# Patient Record
Sex: Male | Born: 1965 | Race: White | Hispanic: No | Marital: Single | Smoking: Current every day smoker
Health system: Southern US, Community
[De-identification: ages and names within clinical notes are randomized; demographics above are authoritative.]

## PROBLEM LIST (undated history)

## (undated) DIAGNOSIS — I2510 Atherosclerotic heart disease of native coronary artery without angina pectoris: Secondary | ICD-10-CM

## (undated) DIAGNOSIS — K219 Gastro-esophageal reflux disease without esophagitis: Secondary | ICD-10-CM

## (undated) DIAGNOSIS — F419 Anxiety disorder, unspecified: Secondary | ICD-10-CM

## (undated) DIAGNOSIS — I10 Essential (primary) hypertension: Secondary | ICD-10-CM

## (undated) DIAGNOSIS — E785 Hyperlipidemia, unspecified: Secondary | ICD-10-CM

## (undated) DIAGNOSIS — E7800 Pure hypercholesterolemia, unspecified: Secondary | ICD-10-CM

## (undated) DIAGNOSIS — G629 Polyneuropathy, unspecified: Secondary | ICD-10-CM

## (undated) DIAGNOSIS — M1990 Unspecified osteoarthritis, unspecified site: Secondary | ICD-10-CM

## (undated) DIAGNOSIS — J189 Pneumonia, unspecified organism: Secondary | ICD-10-CM

## (undated) DIAGNOSIS — E119 Type 2 diabetes mellitus without complications: Secondary | ICD-10-CM

## (undated) DIAGNOSIS — J449 Chronic obstructive pulmonary disease, unspecified: Secondary | ICD-10-CM

## (undated) DIAGNOSIS — R519 Headache, unspecified: Secondary | ICD-10-CM

## (undated) DIAGNOSIS — I639 Cerebral infarction, unspecified: Secondary | ICD-10-CM

## (undated) HISTORY — DX: Gastro-esophageal reflux disease without esophagitis: K21.9

## (undated) HISTORY — DX: Type 2 diabetes mellitus without complications: E11.9

## (undated) HISTORY — PX: UMBILICAL HERNIA REPAIR: SHX196

## (undated) HISTORY — DX: Hyperlipidemia, unspecified: E78.5

## (undated) HISTORY — DX: Chronic obstructive pulmonary disease, unspecified: J44.9

## (undated) HISTORY — PX: UPPER GASTROINTESTINAL ENDOSCOPY: SHX188

## (undated) HISTORY — PX: APPENDECTOMY: SHX54

## (undated) HISTORY — PX: EXPLORATORY LAPAROTOMY: SUR591

## (undated) HISTORY — DX: Polyneuropathy, unspecified: G62.9

## (undated) HISTORY — DX: Pure hypercholesterolemia, unspecified: E78.00

## (undated) HISTORY — PX: FOOT AMPUTATION: SHX951

---

## 2016-09-23 ENCOUNTER — Encounter (INDEPENDENT_AMBULATORY_CARE_PROVIDER_SITE_OTHER): Payer: Self-pay

## 2016-09-23 ENCOUNTER — Encounter (INDEPENDENT_AMBULATORY_CARE_PROVIDER_SITE_OTHER): Payer: Self-pay | Admitting: Internal Medicine

## 2016-10-06 ENCOUNTER — Ambulatory Visit (INDEPENDENT_AMBULATORY_CARE_PROVIDER_SITE_OTHER): Payer: Self-pay | Admitting: Internal Medicine

## 2016-10-06 ENCOUNTER — Ambulatory Visit (INDEPENDENT_AMBULATORY_CARE_PROVIDER_SITE_OTHER): Payer: PRIVATE HEALTH INSURANCE | Admitting: Internal Medicine

## 2016-10-06 ENCOUNTER — Other Ambulatory Visit (INDEPENDENT_AMBULATORY_CARE_PROVIDER_SITE_OTHER): Payer: Self-pay | Admitting: Internal Medicine

## 2016-10-06 ENCOUNTER — Encounter (INDEPENDENT_AMBULATORY_CARE_PROVIDER_SITE_OTHER): Payer: Self-pay | Admitting: *Deleted

## 2016-10-06 ENCOUNTER — Encounter (INDEPENDENT_AMBULATORY_CARE_PROVIDER_SITE_OTHER): Payer: Self-pay | Admitting: Internal Medicine

## 2016-10-06 DIAGNOSIS — K2100 Gastro-esophageal reflux disease with esophagitis, without bleeding: Secondary | ICD-10-CM | POA: Insufficient documentation

## 2016-10-06 DIAGNOSIS — E119 Type 2 diabetes mellitus without complications: Secondary | ICD-10-CM

## 2016-10-06 DIAGNOSIS — K219 Gastro-esophageal reflux disease without esophagitis: Secondary | ICD-10-CM | POA: Insufficient documentation

## 2016-10-06 DIAGNOSIS — R1313 Dysphagia, pharyngeal phase: Secondary | ICD-10-CM | POA: Insufficient documentation

## 2016-10-06 DIAGNOSIS — E7800 Pure hypercholesterolemia, unspecified: Secondary | ICD-10-CM | POA: Diagnosis not present

## 2016-10-06 HISTORY — DX: Type 2 diabetes mellitus without complications: E11.9

## 2016-10-06 HISTORY — DX: Gastro-esophageal reflux disease without esophagitis: K21.9

## 2016-10-06 HISTORY — DX: Pure hypercholesterolemia, unspecified: E78.00

## 2016-10-06 MED ORDER — PANTOPRAZOLE SODIUM 40 MG PO TBEC: 40.0000 mg | DELAYED_RELEASE_TABLET | Freq: Two times a day (BID) | ORAL | 3 refills | Status: AC

## 2016-10-06 NOTE — Progress Notes (Signed)
Subjective:    Patient ID: Clinton Atkinson, male    DOB: 1965-10-14, 51 y.o.   MRN: [id number]   HPI Referred by Dr. McGee/ [facility] for GERD. He tells me he has been having frequent acid reflux daily. Takes Omeprazole as needed.  Hx of same and underwent an EGD in 2010 by Dr. Allena Katz which revealed erosive antral gastritis. No epigastric pain.  His appetite is okay. Sometimes he skips meals because of the pain . He usually has a BM x 1 a day. No melena or BRRB.  He says he has some dysphagia. He can drink water okay when he has the dysphagia. He has some bloating.  Diabetic  Since 1997   09/10/2009 EGD with Biopsy: Erosive antral gastritis. Esophagitis. Esophageal candidiasis. Small hiatal hernia.  Biopsy: Yeast seen. 11/16/2008 Biopsy: Compatible with Reflux esophagitis., chronic gastropathy (antral).    07/27/2016 Vitamin B 12 723, folate 9.6, H and H 17.8 and 51.1, MCV 98., AST 25, ALP 126, Total bili 0.6, ALT 37 Review of Systems Past Medical History:  Diagnosis Date  . COPD (chronic obstructive pulmonary disease) (HCC)   . Diabetes (HCC)   . GERD (gastroesophageal reflux disease)   . Hyperlipemia   . Neuropathy [facility])     Past Surgical History:  Procedure Laterality Date  . APPENDECTOMY    . EXPLORATORY LAPAROTOMY    . UMBILICAL HERNIA REPAIR    . UPPER GASTROINTESTINAL ENDOSCOPY      Allergies  Allergen Reactions  . Ultram [Tramadol] Itching    No current outpatient prescriptions on file prior to visit.   No current facility-administered medications on file prior to visit.    Current Outpatient Prescriptions  Medication Sig Dispense Refill  . albuterol (VENTOLIN HFA) 108 (90 Base) MCG/ACT inhaler Inhale 2 puffs into the lungs every 6 (six) hours as needed for wheezing or shortness of breath.    . ARIPiprazole (ABILIFY) 15 MG tablet Take 15 mg by mouth daily.    . cetirizine (ZYRTEC) 5 MG tablet Take 5 mg by mouth daily.    . diphenhydrAMINE (BENADRYL) 25 mg  capsule Take 25 mg by mouth every 6 (six) hours as needed.    . fenofibrate (TRICOR) 145 MG tablet Take 145 mg by mouth daily.    Marland Kitchen gabapentin (NEURONTIN) 800 MG tablet Take 800 mg by mouth 3 (three) times daily.    . Insulin Glargine (LANTUS SOLOSTAR) 100 UNIT/ML Solostar Pen Inject 35 Units into the skin 2 (two) times daily.    Marland Kitchen lisinopril (PRINIVIL,ZESTRIL) 2.5 MG tablet Take 2.5 mg by mouth daily.    . meloxicam (MOBIC) 7.5 MG tablet Take 7.5 mg by mouth daily as needed for pain.    . Multiple Vitamins-Minerals (MULTI COMPLETE PO) Take by mouth daily.    Marland Kitchen omeprazole (PRILOSEC) 40 MG capsule Take 40 mg by mouth at bedtime as needed.    . rosuvastatin (CRESTOR) 10 MG tablet Take 10 mg by mouth daily.    . sitaGLIPtin (JANUVIA) 100 MG tablet Take 100 mg by mouth daily.    . insulin lispro (HUMALOG) 100 UNIT/ML injection Inject 16 Units into the skin 3 (three) times daily with meals.    . metFORMIN (GLUCOPHAGE) 500 MG tablet Take 500 mg by mouth daily with breakfast.     No current facility-administered medications for this visit.         Objective:   Physical Exam Blood pressure 130/90, pulse 78, resp. rate 18, height 5\' 9"  (1.753  m), weight 203 lb 6.4 oz (92.3 kg).  Alert and oriented. Skin warm and dry. Oral mucosa is moist.   . Sclera anicteric, conjunctivae is pink. Thyroid not enlarged. No cervical lymphadenopathy. Lungs clear. Heart regular rate and rhythm.  Abdomen is soft. Bowel sounds are positive. No hepatomegaly. No abdominal masses felt. No tenderness.  No edema to lower extremities.          Assessment & Plan:  GERD. Am going to change his Omeprazole to Protonix 40mg  BID. EGD/ED. The risks and benefits such as perforation, bleeding, and infection were reviewed with the patient and is agreeable. Advised to eat 4-6 small meals a day. May need NM Emptying study at a later date.

## 2016-10-06 NOTE — Patient Instructions (Signed)
EGD/ED. The risks and benefits such as perforation, bleeding, and infection were reviewed with the patient and is agreeable. 

## 2016-10-13 ENCOUNTER — Ambulatory Visit (HOSPITAL_COMMUNITY)
Admission: RE | Admit: 2016-10-13 | Payer: PRIVATE HEALTH INSURANCE | Source: Ambulatory Visit | Admitting: Internal Medicine

## 2016-10-13 ENCOUNTER — Encounter (HOSPITAL_COMMUNITY): Admission: RE | Payer: Self-pay | Source: Ambulatory Visit

## 2016-10-13 SURGERY — EGD (ESOPHAGOGASTRODUODENOSCOPY)
Anesthesia: Moderate Sedation

## 2016-11-05 ENCOUNTER — Encounter (INDEPENDENT_AMBULATORY_CARE_PROVIDER_SITE_OTHER): Payer: Self-pay

## 2020-06-27 ENCOUNTER — Encounter (HOSPITAL_COMMUNITY): Payer: Self-pay

## 2020-06-27 ENCOUNTER — Emergency Department (HOSPITAL_COMMUNITY): Payer: Medicaid - Out of State

## 2020-06-27 ENCOUNTER — Other Ambulatory Visit: Payer: Self-pay

## 2020-06-27 ENCOUNTER — Inpatient Hospital Stay (HOSPITAL_COMMUNITY)
Admission: EM | Admit: 2020-06-27 | Discharge: 2020-06-29 | DRG: 902 | Disposition: A | Discharge status: Home / Self Care | Payer: Medicaid - Out of State | Attending: Family Medicine | Admitting: Family Medicine

## 2020-06-27 DIAGNOSIS — M868X7 Other osteomyelitis, ankle and foot: Secondary | ICD-10-CM | POA: Diagnosis present

## 2020-06-27 DIAGNOSIS — Z20822 Contact with and (suspected) exposure to covid-19: Secondary | ICD-10-CM | POA: Diagnosis present

## 2020-06-27 DIAGNOSIS — Z7984 Long term (current) use of oral hypoglycemic drugs: Secondary | ICD-10-CM

## 2020-06-27 DIAGNOSIS — L02416 Cutaneous abscess of left lower limb: Secondary | ICD-10-CM | POA: Diagnosis present

## 2020-06-27 DIAGNOSIS — I2510 Atherosclerotic heart disease of native coronary artery without angina pectoris: Secondary | ICD-10-CM | POA: Diagnosis present

## 2020-06-27 DIAGNOSIS — Z888 Allergy status to other drugs, medicaments and biological substances status: Secondary | ICD-10-CM

## 2020-06-27 DIAGNOSIS — T63301A Toxic effect of unspecified spider venom, accidental (unintentional), initial encounter: Secondary | ICD-10-CM | POA: Diagnosis present

## 2020-06-27 DIAGNOSIS — F17210 Nicotine dependence, cigarettes, uncomplicated: Secondary | ICD-10-CM | POA: Diagnosis present

## 2020-06-27 DIAGNOSIS — Z791 Long term (current) use of non-steroidal anti-inflammatories (NSAID): Secondary | ICD-10-CM

## 2020-06-27 DIAGNOSIS — N400 Enlarged prostate without lower urinary tract symptoms: Secondary | ICD-10-CM | POA: Diagnosis present

## 2020-06-27 DIAGNOSIS — K2100 Gastro-esophageal reflux disease with esophagitis, without bleeding: Secondary | ICD-10-CM | POA: Diagnosis present

## 2020-06-27 DIAGNOSIS — Z794 Long term (current) use of insulin: Secondary | ICD-10-CM | POA: Diagnosis not present

## 2020-06-27 DIAGNOSIS — Z955 Presence of coronary angioplasty implant and graft: Secondary | ICD-10-CM | POA: Diagnosis not present

## 2020-06-27 DIAGNOSIS — E785 Hyperlipidemia, unspecified: Secondary | ICD-10-CM | POA: Diagnosis present

## 2020-06-27 DIAGNOSIS — J449 Chronic obstructive pulmonary disease, unspecified: Secondary | ICD-10-CM | POA: Diagnosis present

## 2020-06-27 DIAGNOSIS — L03116 Cellulitis of left lower limb: Secondary | ICD-10-CM | POA: Diagnosis present

## 2020-06-27 DIAGNOSIS — I10 Essential (primary) hypertension: Secondary | ICD-10-CM | POA: Diagnosis present

## 2020-06-27 DIAGNOSIS — E1165 Type 2 diabetes mellitus with hyperglycemia: Secondary | ICD-10-CM | POA: Diagnosis present

## 2020-06-27 DIAGNOSIS — Z881 Allergy status to other antibiotic agents status: Secondary | ICD-10-CM

## 2020-06-27 DIAGNOSIS — Z79899 Other long term (current) drug therapy: Secondary | ICD-10-CM | POA: Diagnosis not present

## 2020-06-27 DIAGNOSIS — E7800 Pure hypercholesterolemia, unspecified: Secondary | ICD-10-CM | POA: Diagnosis present

## 2020-06-27 DIAGNOSIS — E872 Acidosis, unspecified: Secondary | ICD-10-CM

## 2020-06-27 DIAGNOSIS — T148XXA Other injury of unspecified body region, initial encounter: Secondary | ICD-10-CM

## 2020-06-27 DIAGNOSIS — B351 Tinea unguium: Secondary | ICD-10-CM | POA: Diagnosis present

## 2020-06-27 DIAGNOSIS — E119 Type 2 diabetes mellitus without complications: Secondary | ICD-10-CM

## 2020-06-27 DIAGNOSIS — L089 Local infection of the skin and subcutaneous tissue, unspecified: Secondary | ICD-10-CM | POA: Diagnosis not present

## 2020-06-27 DIAGNOSIS — Z89431 Acquired absence of right foot: Secondary | ICD-10-CM

## 2020-06-27 DIAGNOSIS — E1140 Type 2 diabetes mellitus with diabetic neuropathy, unspecified: Secondary | ICD-10-CM | POA: Diagnosis present

## 2020-06-27 LAB — CBC WITH DIFFERENTIAL/PLATELET
Abs Immature Granulocytes: 0.09 10*3/uL — ABNORMAL HIGH (ref 0.00–0.07)
Basophils Absolute: 0.1 10*3/uL (ref 0.0–0.1)
Basophils Relative: 1 %
Eosinophils Absolute: 0.3 10*3/uL (ref 0.0–0.5)
Eosinophils Relative: 2 %
HCT: 50.1 % (ref 39.0–52.0)
Hemoglobin: 17 g/dL (ref 13.0–17.0)
Immature Granulocytes: 1 %
Lymphocytes Relative: 33 %
Lymphs Abs: 4.1 10*3/uL — ABNORMAL HIGH (ref 0.7–4.0)
MCH: 31.1 pg (ref 26.0–34.0)
MCHC: 33.9 g/dL (ref 30.0–36.0)
MCV: 91.8 fL (ref 80.0–100.0)
Monocytes Absolute: 0.9 10*3/uL (ref 0.1–1.0)
Monocytes Relative: 7 %
Neutro Abs: 7 10*3/uL (ref 1.7–7.7)
Neutrophils Relative %: 56 %
Platelets: 302 10*3/uL (ref 150–400)
RBC: 5.46 MIL/uL (ref 4.22–5.81)
RDW: 12.9 % (ref 11.5–15.5)
WBC: 12.5 10*3/uL — ABNORMAL HIGH (ref 4.0–10.5)
nRBC: 0 % (ref 0.0–0.2)

## 2020-06-27 LAB — COMPREHENSIVE METABOLIC PANEL
ALT: 17 U/L (ref 0–44)
AST: 15 U/L (ref 15–41)
Albumin: 3.6 g/dL (ref 3.5–5.0)
Alkaline Phosphatase: 117 U/L (ref 38–126)
Anion gap: 11 (ref 5–15)
BUN: 18 mg/dL (ref 6–20)
CO2: 23 mmol/L (ref 22–32)
Calcium: 9.2 mg/dL (ref 8.9–10.3)
Chloride: 97 mmol/L — ABNORMAL LOW (ref 98–111)
Creatinine, Ser: 0.83 mg/dL (ref 0.61–1.24)
GFR calc Af Amer: >60 mL/min (ref >60)
GFR calc non Af Amer: >60 mL/min (ref >60)
Glucose, Bld: 584 mg/dL (ref 70–99)
Potassium: 3.8 mmol/L (ref 3.5–5.1)
Sodium: 131 mmol/L — ABNORMAL LOW (ref 135–145)
Total Bilirubin: 0.4 mg/dL (ref 0.3–1.2)
Total Protein: 7.7 g/dL (ref 6.5–8.1)

## 2020-06-27 LAB — CBG MONITORING, ED
Glucose-Capillary: 581 mg/dL (ref 70–99)
Glucose-Capillary: 230 mg/dL — ABNORMAL HIGH (ref 70–99)

## 2020-06-27 LAB — RESPIRATORY PANEL BY RT PCR (FLU A&B, COVID)
Influenza A by PCR: NEGATIVE
Influenza B by PCR: NEGATIVE
SARS Coronavirus 2 by RT PCR: NEGATIVE

## 2020-06-27 LAB — SEDIMENTATION RATE: Sed Rate: 40 mm/hr — ABNORMAL HIGH (ref 0–16)

## 2020-06-27 LAB — LACTIC ACID, PLASMA
Lactic Acid, Venous: 2.7 mmol/L (ref 0.5–1.9)
Lactic Acid, Venous: 2.7 mmol/L (ref 0.5–1.9)

## 2020-06-27 LAB — C-REACTIVE PROTEIN: CRP: 2.6 mg/dL — ABNORMAL HIGH (ref <1.0)

## 2020-06-27 MED ORDER — PRASUGREL HCL 10 MG PO TABS
10.0000 mg | ORAL_TABLET | Freq: Every day | ORAL | Status: DC
  Administered 2020-06-29: 10 mg via ORAL
  Filled 2020-06-27: qty 1

## 2020-06-27 MED ORDER — GADOBUTROL 1 MMOL/ML IV SOLN
7.0000 mL | Freq: Once | INTRAVENOUS | Status: AC | PRN
  Administered 2020-06-27: 7 mL via INTRAVENOUS

## 2020-06-27 MED ORDER — INSULIN ASPART 100 UNIT/ML ~~LOC~~ SOLN
0.0000 [IU] | Freq: Three times a day (TID) | SUBCUTANEOUS | Status: DC
  Administered 2020-06-28: 11 [IU] via SUBCUTANEOUS
  Administered 2020-06-28: 5 [IU] via SUBCUTANEOUS
  Administered 2020-06-28: 11 [IU] via SUBCUTANEOUS
  Administered 2020-06-29 (×2): 8 [IU] via SUBCUTANEOUS

## 2020-06-27 MED ORDER — VANCOMYCIN HCL 1500 MG/300ML IV SOLN
1500.0000 mg | Freq: Once | INTRAVENOUS | Status: AC
  Administered 2020-06-27: 1500 mg via INTRAVENOUS
  Filled 2020-06-27: qty 300

## 2020-06-27 MED ORDER — PIPERACILLIN-TAZOBACTAM 3.375 G IVPB 30 MIN
3.3750 g | Freq: Once | INTRAVENOUS | Status: AC
  Administered 2020-06-27: 3.375 g via INTRAVENOUS
  Filled 2020-06-27: qty 50

## 2020-06-27 MED ORDER — SODIUM CHLORIDE 0.9 % IV SOLN: INTRAVENOUS | Status: DC

## 2020-06-27 MED ORDER — SODIUM CHLORIDE 0.9 % IV BOLUS
1000.0000 mL | Freq: Once | INTRAVENOUS | Status: AC
  Administered 2020-06-27: 1000 mL via INTRAVENOUS

## 2020-06-27 MED ORDER — AMITRIPTYLINE HCL 50 MG PO TABS
50.0000 mg | ORAL_TABLET | Freq: Every day | ORAL | Status: DC
  Administered 2020-06-27 – 2020-06-28 (×2): 50 mg via ORAL
  Filled 2020-06-27: qty 2
  Filled 2020-06-27 (×4): qty 1

## 2020-06-27 MED ORDER — INSULIN ASPART 100 UNIT/ML ~~LOC~~ SOLN
0.0000 [IU] | Freq: Every day | SUBCUTANEOUS | Status: DC
  Administered 2020-06-27: 2 [IU] via SUBCUTANEOUS
  Administered 2020-06-28: 3 [IU] via SUBCUTANEOUS
  Filled 2020-06-27: qty 1

## 2020-06-27 MED ORDER — ROSUVASTATIN CALCIUM 20 MG PO TABS
20.0000 mg | ORAL_TABLET | Freq: Every evening | ORAL | Status: DC
  Administered 2020-06-27 – 2020-06-28 (×2): 20 mg via ORAL
  Filled 2020-06-27 (×5): qty 1

## 2020-06-27 MED ORDER — VANCOMYCIN HCL IN DEXTROSE 1-5 GM/200ML-% IV SOLN: 1000.0000 mg | Freq: Once | INTRAVENOUS | Status: DC

## 2020-06-27 MED ORDER — LISINOPRIL 5 MG PO TABS: 2.5000 mg | ORAL_TABLET | Freq: Every evening | ORAL | Status: DC

## 2020-06-27 MED ORDER — ROPINIROLE HCL 0.5 MG PO TABS
2.0000 mg | ORAL_TABLET | Freq: Every day | ORAL | Status: DC
  Administered 2020-06-27: 2 mg via ORAL
  Filled 2020-06-27 (×5): qty 2
  Filled 2020-06-27: qty 4

## 2020-06-27 MED ORDER — FENOFIBRATE 160 MG PO TABS
160.0000 mg | ORAL_TABLET | Freq: Every day | ORAL | Status: DC
  Filled 2020-06-27 (×3): qty 1

## 2020-06-27 MED ORDER — ACETAMINOPHEN 325 MG PO TABS: 650.0000 mg | ORAL_TABLET | Freq: Four times a day (QID) | ORAL | Status: DC | PRN

## 2020-06-27 MED ORDER — INSULIN ASPART 100 UNIT/ML ~~LOC~~ SOLN
15.0000 [IU] | Freq: Once | SUBCUTANEOUS | Status: AC
  Administered 2020-06-27: 15 [IU] via SUBCUTANEOUS
  Filled 2020-06-27: qty 1

## 2020-06-27 MED ORDER — SODIUM POLYSTYRENE SULFONATE 15 GM/60ML PO SUSP: 15.0000 g | Freq: Once | ORAL | Status: DC

## 2020-06-27 MED ORDER — INSULIN GLARGINE 100 UNIT/ML ~~LOC~~ SOLN
35.0000 [IU] | Freq: Two times a day (BID) | SUBCUTANEOUS | Status: DC
  Administered 2020-06-27: 35 [IU] via SUBCUTANEOUS
  Filled 2020-06-27 (×6): qty 0.35

## 2020-06-27 MED ORDER — FENTANYL CITRATE (PF) 100 MCG/2ML IJ SOLN
50.0000 ug | INTRAMUSCULAR | Status: DC | PRN
  Administered 2020-06-27: 50 ug via INTRAVENOUS
  Filled 2020-06-27: qty 2

## 2020-06-27 MED ORDER — TAMSULOSIN HCL 0.4 MG PO CAPS
0.4000 mg | ORAL_CAPSULE | Freq: Every day | ORAL | Status: DC
  Administered 2020-06-27 – 2020-06-29 (×2): 0.4 mg via ORAL
  Filled 2020-06-27 (×2): qty 1

## 2020-06-27 MED ORDER — SODIUM CHLORIDE 0.9 % IV SOLN
2.0000 g | Freq: Three times a day (TID) | INTRAVENOUS | Status: DC
  Administered 2020-06-28 – 2020-06-29 (×5): 2 g via INTRAVENOUS
  Filled 2020-06-27 (×5): qty 2

## 2020-06-27 MED ORDER — ONDANSETRON 4 MG PO TBDP: 4.0000 mg | ORAL_TABLET | Freq: Two times a day (BID) | ORAL | Status: DC | PRN

## 2020-06-27 MED ORDER — ALBUTEROL SULFATE HFA 108 (90 BASE) MCG/ACT IN AERS
2.0000 | INHALATION_SPRAY | Freq: Four times a day (QID) | RESPIRATORY_TRACT | Status: DC | PRN
  Filled 2020-06-27: qty 6.7

## 2020-06-27 MED ORDER — ACETAMINOPHEN 650 MG RE SUPP: 650.0000 mg | Freq: Four times a day (QID) | RECTAL | Status: DC | PRN

## 2020-06-27 MED ORDER — LOSARTAN POTASSIUM 50 MG PO TABS
25.0000 mg | ORAL_TABLET | Freq: Every day | ORAL | Status: DC
  Administered 2020-06-27 – 2020-06-29 (×2): 25 mg via ORAL
  Filled 2020-06-27 (×2): qty 1

## 2020-06-27 MED ORDER — ENOXAPARIN SODIUM 40 MG/0.4ML ~~LOC~~ SOLN: 40.0000 mg | SUBCUTANEOUS | Status: DC

## 2020-06-27 MED ORDER — PANTOPRAZOLE SODIUM 40 MG PO TBEC
40.0000 mg | DELAYED_RELEASE_TABLET | Freq: Two times a day (BID) | ORAL | Status: DC
  Administered 2020-06-28 – 2020-06-29 (×2): 40 mg via ORAL
  Filled 2020-06-27 (×2): qty 1

## 2020-06-27 MED ORDER — METRONIDAZOLE 500 MG PO TABS
500.0000 mg | ORAL_TABLET | Freq: Three times a day (TID) | ORAL | Status: DC
  Administered 2020-06-27 – 2020-06-29 (×6): 500 mg via ORAL
  Filled 2020-06-27 (×6): qty 1

## 2020-06-27 MED ORDER — GABAPENTIN 400 MG PO CAPS
800.0000 mg | ORAL_CAPSULE | Freq: Three times a day (TID) | ORAL | Status: DC
  Administered 2020-06-27 – 2020-06-29 (×4): 800 mg via ORAL
  Filled 2020-06-27 (×4): qty 2

## 2020-06-27 MED ORDER — ADULT MULTIVITAMIN W/MINERALS CH
1.0000 | ORAL_TABLET | Freq: Every evening | ORAL | Status: DC
  Administered 2020-06-27 – 2020-06-28 (×2): 1 via ORAL
  Filled 2020-06-27 (×2): qty 1

## 2020-06-27 MED ORDER — DULOXETINE HCL 30 MG PO CPEP
30.0000 mg | ORAL_CAPSULE | Freq: Every day | ORAL | Status: DC
  Administered 2020-06-27 – 2020-06-29 (×2): 30 mg via ORAL
  Filled 2020-06-27 (×2): qty 1

## 2020-06-27 MED ORDER — VANCOMYCIN HCL IN DEXTROSE 1-5 GM/200ML-% IV SOLN
1000.0000 mg | Freq: Three times a day (TID) | INTRAVENOUS | Status: DC
  Administered 2020-06-28 – 2020-06-29 (×4): 1000 mg via INTRAVENOUS
  Filled 2020-06-27 (×8): qty 200

## 2020-06-27 MED ORDER — OXYCODONE HCL 5 MG PO TABS: 5.0000 mg | ORAL_TABLET | ORAL | Status: DC | PRN

## 2020-06-27 MED ORDER — ARIPIPRAZOLE 5 MG PO TABS
15.0000 mg | ORAL_TABLET | Freq: Every evening | ORAL | Status: DC
  Administered 2020-06-27 – 2020-06-28 (×2): 15 mg via ORAL
  Filled 2020-06-27: qty 3
  Filled 2020-06-27: qty 1

## 2020-06-27 MED ORDER — LINAGLIPTIN 5 MG PO TABS
5.0000 mg | ORAL_TABLET | Freq: Every day | ORAL | Status: DC
  Administered 2020-06-27 – 2020-06-29 (×2): 5 mg via ORAL
  Filled 2020-06-27 (×6): qty 1

## 2020-06-27 NOTE — ED Triage Notes (Signed)
Pt got bitten by a spider 8 days ago on left ankle. Pt went to [facility] and left. Large wound noted to left ankle.
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
## 2020-06-27 NOTE — Progress Notes (Signed)
Pharmacy Antibiotic Note  Clinton Atkinson is a 54 y.o. male admitted on 06/27/2020 with DFI.  Pharmacy has been consulted for vancomycin and cefepime dosing.  Given zosyn + vanc 1500 mg x 1 in ED.  Flagyl per MD.  Plan: Vancomycin 1000 mg IV every 8 hours (target vancomycin trough 15-20) Cefepime 2g IV every 8 hours Monitor renal function, Cx and clinical progression to narrow Vancomycin trough at steady state  Height: 5\' 9"  (175.3 cm) Weight: 79.4 kg (175 lb) IBW/kg (Calculated) : 70.7  Temp (24hrs), Avg:97.6 F (36.4 C), Min:97.6 F (36.4 C), Max:97.6 F (36.4 C)  Recent Labs  Lab 06/27/20 1543 06/27/20 1735  WBC 12.5*  --   CREATININE 0.83  --   LATICACIDVEN 2.7* 2.7*    Estimated Creatinine Clearance: 102.9 mL/min (by C-G formula based on SCr of 0.83 mg/dL).    Allergies  Allergen Reactions  . Metformin Diarrhea    Stomach cramp/inability to eat  . Ultram [Tramadol] Itching    08/27/20, PharmD Clinical Pharmacist ED Pharmacist Phone # [phone] 06/27/2020 6:53 PM
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
## 2020-06-27 NOTE — ED Provider Notes (Signed)
ATTENDING SUPERVISORY NOTE I have personally viewed the imaging studies performed. I have personally seen and examined the patient, and discussed the plan of care with the resident.  I have reviewed the documentation of the resident and agree.  No diagnosis found.  .Critical Care Performed by: Blane Ohara, MD Authorized by: Blane Ohara, MD   Critical care provider statement:    Critical care time (minutes):  35   Critical care start time:  06/27/2020 4:40 PM   Critical care was time spent personally by me on the following activities:  Evaluation of patient's response to treatment, examination of patient, ordering and performing treatments and interventions, ordering and review of laboratory studies, ordering and review of radiographic studies, pulse oximetry, re-evaluation of patient's condition and review of old charts Comments:     IV abx, worsening skin infection in DM      Blane Ohara, MD 06/28/20 0041

## 2020-06-28 ENCOUNTER — Inpatient Hospital Stay (HOSPITAL_COMMUNITY): Payer: Medicaid - Out of State | Admitting: Certified Registered Nurse Anesthetist

## 2020-06-28 ENCOUNTER — Encounter (HOSPITAL_COMMUNITY): Payer: Self-pay | Admitting: Internal Medicine

## 2020-06-28 ENCOUNTER — Encounter (HOSPITAL_COMMUNITY)
Admission: EM | Disposition: A | Discharge status: Home / Self Care | Payer: Self-pay | Source: Home / Self Care | Attending: Family Medicine

## 2020-06-28 ENCOUNTER — Other Ambulatory Visit: Payer: Self-pay

## 2020-06-28 DIAGNOSIS — T148XXA Other injury of unspecified body region, initial encounter: Secondary | ICD-10-CM | POA: Diagnosis not present

## 2020-06-28 DIAGNOSIS — L089 Local infection of the skin and subcutaneous tissue, unspecified: Secondary | ICD-10-CM | POA: Diagnosis not present

## 2020-06-28 HISTORY — PX: APPLICATION OF WOUND VAC: SHX5189

## 2020-06-28 HISTORY — PX: I & D EXTREMITY: SHX5045

## 2020-06-28 LAB — HEMOGLOBIN A1C
Hgb A1c MFr Bld: 12.8 % — ABNORMAL HIGH (ref 4.8–5.6)
Mean Plasma Glucose: 320.66 mg/dL

## 2020-06-28 LAB — POCT I-STAT, CHEM 8
BUN: 15 mg/dL (ref 6–20)
Calcium, Ion: 1.14 mmol/L — ABNORMAL LOW (ref 1.15–1.40)
Chloride: 103 mmol/L (ref 98–111)
Creatinine, Ser: 0.5 mg/dL — ABNORMAL LOW (ref 0.61–1.24)
Glucose, Bld: 313 mg/dL — ABNORMAL HIGH (ref 70–99)
HCT: 41 % (ref 39.0–52.0)
Hemoglobin: 13.9 g/dL (ref 13.0–17.0)
Potassium: 4 mmol/L (ref 3.5–5.1)
Sodium: 136 mmol/L (ref 135–145)
TCO2: 23 mmol/L (ref 22–32)

## 2020-06-28 LAB — CBC
HCT: 40.6 % (ref 39.0–52.0)
Hemoglobin: 13.4 g/dL (ref 13.0–17.0)
MCH: 30.5 pg (ref 26.0–34.0)
MCHC: 33 g/dL (ref 30.0–36.0)
MCV: 92.5 fL (ref 80.0–100.0)
Platelets: 222 10*3/uL (ref 150–400)
RBC: 4.39 MIL/uL (ref 4.22–5.81)
RDW: 12.7 % (ref 11.5–15.5)
WBC: 10.8 10*3/uL — ABNORMAL HIGH (ref 4.0–10.5)
nRBC: 0 % (ref 0.0–0.2)

## 2020-06-28 LAB — SURGICAL PCR SCREEN
MRSA, PCR: NEGATIVE
Staphylococcus aureus: NEGATIVE

## 2020-06-28 LAB — GLUCOSE, CAPILLARY
Glucose-Capillary: 301 mg/dL — ABNORMAL HIGH (ref 70–99)
Glucose-Capillary: 312 mg/dL — ABNORMAL HIGH (ref 70–99)
Glucose-Capillary: 202 mg/dL — ABNORMAL HIGH (ref 70–99)
Glucose-Capillary: 236 mg/dL — ABNORMAL HIGH (ref 70–99)
Glucose-Capillary: 341 mg/dL — ABNORMAL HIGH (ref 70–99)

## 2020-06-28 LAB — CREATININE, SERUM
Creatinine, Ser: 0.75 mg/dL (ref 0.61–1.24)
GFR calc Af Amer: >60 mL/min (ref >60)
GFR calc non Af Amer: >60 mL/min (ref >60)

## 2020-06-28 LAB — PREALBUMIN: Prealbumin: 18.7 mg/dL (ref 18–38)

## 2020-06-28 LAB — HIV ANTIBODY (ROUTINE TESTING W REFLEX): HIV Screen 4th Generation wRfx: NONREACTIVE

## 2020-06-28 SURGERY — IRRIGATION AND DEBRIDEMENT EXTREMITY
Anesthesia: General | Site: Ankle | Laterality: Left

## 2020-06-28 MED ORDER — ALBUMIN HUMAN 5 % IV SOLN
INTRAVENOUS | Status: AC
  Filled 2020-06-28: qty 250

## 2020-06-28 MED ORDER — ONDANSETRON HCL 4 MG PO TABS: 4.0000 mg | ORAL_TABLET | Freq: Four times a day (QID) | ORAL | Status: DC | PRN

## 2020-06-28 MED ORDER — METHOCARBAMOL 500 MG PO TABS: 500.0000 mg | ORAL_TABLET | Freq: Four times a day (QID) | ORAL | Status: DC | PRN

## 2020-06-28 MED ORDER — ONDANSETRON HCL 4 MG/2ML IJ SOLN: 4.0000 mg | Freq: Four times a day (QID) | INTRAMUSCULAR | Status: DC | PRN

## 2020-06-28 MED ORDER — OXYCODONE HCL 5 MG PO TABS: 5.0000 mg | ORAL_TABLET | Freq: Once | ORAL | Status: DC | PRN

## 2020-06-28 MED ORDER — LACTATED RINGERS IV SOLN: INTRAVENOUS | Status: DC

## 2020-06-28 MED ORDER — ACETAMINOPHEN 325 MG PO TABS: 325.0000 mg | ORAL_TABLET | Freq: Once | ORAL | Status: DC | PRN

## 2020-06-28 MED ORDER — CHLORHEXIDINE GLUCONATE 0.12 % MT SOLN
OROMUCOSAL | Status: AC
  Administered 2020-06-28: 15 mL via OROMUCOSAL
  Filled 2020-06-28: qty 15

## 2020-06-28 MED ORDER — EPHEDRINE SULFATE-NACL 50-0.9 MG/10ML-% IV SOSY
PREFILLED_SYRINGE | INTRAVENOUS | Status: DC | PRN
  Administered 2020-06-28: 15 mg via INTRAVENOUS

## 2020-06-28 MED ORDER — ACETAMINOPHEN 160 MG/5ML PO SOLN: 325.0000 mg | Freq: Once | ORAL | Status: DC | PRN

## 2020-06-28 MED ORDER — OXYCODONE HCL 5 MG/5ML PO SOLN: 5.0000 mg | Freq: Once | ORAL | Status: DC | PRN

## 2020-06-28 MED ORDER — INSULIN GLARGINE 100 UNIT/ML ~~LOC~~ SOLN
45.0000 [IU] | Freq: Two times a day (BID) | SUBCUTANEOUS | Status: DC
  Administered 2020-06-28 – 2020-06-29 (×2): 45 [IU] via SUBCUTANEOUS
  Filled 2020-06-28 (×3): qty 0.45

## 2020-06-28 MED ORDER — INSULIN ASPART 100 UNIT/ML ~~LOC~~ SOLN
SUBCUTANEOUS | Status: AC
  Filled 2020-06-28: qty 1

## 2020-06-28 MED ORDER — PHENYLEPHRINE 40 MCG/ML (10ML) SYRINGE FOR IV PUSH (FOR BLOOD PRESSURE SUPPORT)
PREFILLED_SYRINGE | INTRAVENOUS | Status: DC | PRN
  Administered 2020-06-28: 120 ug via INTRAVENOUS
  Administered 2020-06-28: 80 ug via INTRAVENOUS

## 2020-06-28 MED ORDER — MAGNESIUM CITRATE PO SOLN: 1.0000 | Freq: Once | ORAL | Status: DC | PRN

## 2020-06-28 MED ORDER — CEFAZOLIN SODIUM-DEXTROSE 1-4 GM/50ML-% IV SOLN: 1.0000 g | Freq: Four times a day (QID) | INTRAVENOUS | Status: DC

## 2020-06-28 MED ORDER — PHENYLEPHRINE 40 MCG/ML (10ML) SYRINGE FOR IV PUSH (FOR BLOOD PRESSURE SUPPORT)
PREFILLED_SYRINGE | INTRAVENOUS | Status: AC
  Filled 2020-06-28: qty 10

## 2020-06-28 MED ORDER — CELECOXIB 200 MG PO CAPS
200.0000 mg | ORAL_CAPSULE | Freq: Two times a day (BID) | ORAL | Status: DC
  Administered 2020-06-28 – 2020-06-29 (×2): 200 mg via ORAL
  Filled 2020-06-28 (×2): qty 1

## 2020-06-28 MED ORDER — ACETAMINOPHEN 10 MG/ML IV SOLN: 1000.0000 mg | Freq: Once | INTRAVENOUS | Status: DC | PRN

## 2020-06-28 MED ORDER — ALBUMIN HUMAN 5 % IV SOLN: 12.5000 g | Freq: Once | INTRAVENOUS | Status: DC

## 2020-06-28 MED ORDER — AMISULPRIDE (ANTIEMETIC) 5 MG/2ML IV SOLN: 10.0000 mg | Freq: Once | INTRAVENOUS | Status: DC | PRN

## 2020-06-28 MED ORDER — HYDROMORPHONE HCL 1 MG/ML IJ SOLN
INTRAMUSCULAR | Status: AC
Start: 2020-06-28 — End: 2020-06-28
  Filled 2020-06-28: qty 1

## 2020-06-28 MED ORDER — DIPHENHYDRAMINE HCL 12.5 MG/5ML PO ELIX: 12.5000 mg | ORAL_SOLUTION | ORAL | Status: DC | PRN

## 2020-06-28 MED ORDER — EPHEDRINE 5 MG/ML INJ
INTRAVENOUS | Status: AC
  Filled 2020-06-28: qty 10

## 2020-06-28 MED ORDER — FENTANYL CITRATE (PF) 100 MCG/2ML IJ SOLN
INTRAMUSCULAR | Status: DC | PRN
Start: 2020-06-28 — End: 2020-06-28
  Administered 2020-06-28: 50 ug via INTRAVENOUS

## 2020-06-28 MED ORDER — METOCLOPRAMIDE HCL 5 MG/ML IJ SOLN: 5.0000 mg | Freq: Three times a day (TID) | INTRAMUSCULAR | Status: DC | PRN

## 2020-06-28 MED ORDER — CHLORHEXIDINE GLUCONATE 0.12 % MT SOLN: 15.0000 mL | Freq: Once | OROMUCOSAL | Status: AC

## 2020-06-28 MED ORDER — DOCUSATE SODIUM 100 MG PO CAPS
100.0000 mg | ORAL_CAPSULE | Freq: Two times a day (BID) | ORAL | Status: DC
  Administered 2020-06-28 – 2020-06-29 (×2): 100 mg via ORAL
  Filled 2020-06-28 (×2): qty 1

## 2020-06-28 MED ORDER — MEPERIDINE HCL 25 MG/ML IJ SOLN: 6.2500 mg | INTRAMUSCULAR | Status: DC | PRN

## 2020-06-28 MED ORDER — OXYCODONE HCL 5 MG PO TABS: 5.0000 mg | ORAL_TABLET | ORAL | Status: DC | PRN

## 2020-06-28 MED ORDER — HYDROMORPHONE HCL 1 MG/ML IJ SOLN: 0.5000 mg | INTRAMUSCULAR | Status: DC | PRN

## 2020-06-28 MED ORDER — ACETAMINOPHEN 500 MG PO TABS
1000.0000 mg | ORAL_TABLET | Freq: Three times a day (TID) | ORAL | Status: AC
  Administered 2020-06-28 – 2020-06-29 (×4): 1000 mg via ORAL
  Filled 2020-06-28 (×4): qty 2

## 2020-06-28 MED ORDER — METOCLOPRAMIDE HCL 5 MG PO TABS: 5.0000 mg | ORAL_TABLET | Freq: Three times a day (TID) | ORAL | Status: DC | PRN

## 2020-06-28 MED ORDER — PROPOFOL 10 MG/ML IV BOLUS
INTRAVENOUS | Status: AC
  Filled 2020-06-28: qty 20

## 2020-06-28 MED ORDER — LIDOCAINE 2% (20 MG/ML) 5 ML SYRINGE
INTRAMUSCULAR | Status: AC
  Filled 2020-06-28: qty 5

## 2020-06-28 MED ORDER — ALBUMIN HUMAN 5 % IV SOLN
12.5000 g | Freq: Once | INTRAVENOUS | Status: AC
  Administered 2020-06-28: 12.5 g via INTRAVENOUS

## 2020-06-28 MED ORDER — ENOXAPARIN SODIUM 40 MG/0.4ML ~~LOC~~ SOLN
40.0000 mg | SUBCUTANEOUS | Status: DC
  Administered 2020-06-29: 40 mg via SUBCUTANEOUS
  Filled 2020-06-28: qty 0.4

## 2020-06-28 MED ORDER — FENTANYL CITRATE (PF) 250 MCG/5ML IJ SOLN
INTRAMUSCULAR | Status: AC
  Filled 2020-06-28: qty 5

## 2020-06-28 MED ORDER — SODIUM CHLORIDE 0.9 % IR SOLN
Status: DC | PRN
  Administered 2020-06-28: 3000 mL

## 2020-06-28 MED ORDER — POLYETHYLENE GLYCOL 3350 17 G PO PACK: 17.0000 g | PACK | Freq: Every day | ORAL | Status: DC | PRN

## 2020-06-28 MED ORDER — ONDANSETRON HCL 4 MG/2ML IJ SOLN
INTRAMUSCULAR | Status: AC
  Filled 2020-06-28: qty 2

## 2020-06-28 MED ORDER — MIDAZOLAM HCL 5 MG/5ML IJ SOLN
INTRAMUSCULAR | Status: DC | PRN
  Administered 2020-06-28: 2 mg via INTRAVENOUS

## 2020-06-28 MED ORDER — PROPOFOL 10 MG/ML IV BOLUS
INTRAVENOUS | Status: DC | PRN
  Administered 2020-06-28: 150 mg via INTRAVENOUS

## 2020-06-28 MED ORDER — BISACODYL 5 MG PO TBEC: 5.0000 mg | DELAYED_RELEASE_TABLET | Freq: Every day | ORAL | Status: DC | PRN

## 2020-06-28 MED ORDER — METHOCARBAMOL 1000 MG/10ML IJ SOLN
500.0000 mg | Freq: Four times a day (QID) | INTRAVENOUS | Status: DC | PRN
  Filled 2020-06-28: qty 5

## 2020-06-28 MED ORDER — ONDANSETRON HCL 4 MG/2ML IJ SOLN
INTRAMUSCULAR | Status: DC | PRN
  Administered 2020-06-28: 4 mg via INTRAVENOUS

## 2020-06-28 MED ORDER — MIDAZOLAM HCL 2 MG/2ML IJ SOLN
INTRAMUSCULAR | Status: AC
  Filled 2020-06-28: qty 2

## 2020-06-28 MED ORDER — OXYCODONE HCL 5 MG PO TABS
10.0000 mg | ORAL_TABLET | ORAL | Status: DC | PRN
  Administered 2020-06-28 – 2020-06-29 (×2): 10 mg via ORAL
  Filled 2020-06-28 (×2): qty 2

## 2020-06-28 MED ORDER — LIDOCAINE 2% (20 MG/ML) 5 ML SYRINGE
INTRAMUSCULAR | Status: DC | PRN
  Administered 2020-06-28: 40 mg via INTRAVENOUS

## 2020-06-28 MED ORDER — HYDROMORPHONE HCL 1 MG/ML IJ SOLN
0.2500 mg | INTRAMUSCULAR | Status: DC | PRN
  Administered 2020-06-28: 0.5 mg via INTRAVENOUS

## 2020-06-28 MED ORDER — INSULIN ASPART 100 UNIT/ML ~~LOC~~ SOLN
10.0000 [IU] | Freq: Once | SUBCUTANEOUS | Status: AC
  Administered 2020-06-28: 10 [IU] via INTRAVENOUS

## 2020-06-28 SURGICAL SUPPLY — 56 items
BLADE SURG 10 STRL SS (BLADE) ×2 IMPLANT
BNDG COHESIVE 4X5 TAN STRL (GAUZE/BANDAGES/DRESSINGS) ×2 IMPLANT
BNDG ELASTIC 4X5.8 VLCR STR LF (GAUZE/BANDAGES/DRESSINGS) ×2 IMPLANT
BNDG ELASTIC 6X5.8 VLCR STR LF (GAUZE/BANDAGES/DRESSINGS) ×2 IMPLANT
BNDG GAUZE ELAST 4 BULKY (GAUZE/BANDAGES/DRESSINGS) ×2 IMPLANT
CNTNR URN SCR LID CUP LEK RST (MISCELLANEOUS) IMPLANT
CONT SPEC 4OZ STRL OR WHT (MISCELLANEOUS)
COVER SURGICAL LIGHT HANDLE (MISCELLANEOUS) ×2 IMPLANT
CUFF TOURN SGL QUICK 34 (TOURNIQUET CUFF)
CUFF TRNQT CYL 34X4.125X (TOURNIQUET CUFF) IMPLANT
DRAPE DERMATAC (DRAPES) ×1 IMPLANT
DRAPE U-SHAPE 47X51 STRL (DRAPES) ×1 IMPLANT
DRESSING VERAFLO CLEANSE CC (GAUZE/BANDAGES/DRESSINGS) IMPLANT
DRSG PAD ABDOMINAL 8X10 ST (GAUZE/BANDAGES/DRESSINGS) ×2 IMPLANT
DRSG VERAFLO CLEANSE CC (GAUZE/BANDAGES/DRESSINGS) ×2
DURAPREP 26ML APPLICATOR (WOUND CARE) ×2 IMPLANT
ELECT REM PT RETURN 9FT ADLT (ELECTROSURGICAL)
ELECTRODE REM PT RTRN 9FT ADLT (ELECTROSURGICAL) IMPLANT
EVACUATOR 1/8 PVC DRAIN (DRAIN) IMPLANT
FACESHIELD WRAPAROUND (MASK) ×2 IMPLANT
FACESHIELD WRAPAROUND OR TEAM (MASK) ×1 IMPLANT
GAUZE SPONGE 4X4 12PLY STRL (GAUZE/BANDAGES/DRESSINGS) ×2 IMPLANT
GLOVE BIO SURGEON STRL SZ7.5 (GLOVE) ×2 IMPLANT
GLOVE BIOGEL PI IND STRL 7.5 (GLOVE) ×1 IMPLANT
GLOVE BIOGEL PI IND STRL 8 (GLOVE) ×1 IMPLANT
GLOVE BIOGEL PI INDICATOR 7.5 (GLOVE) ×1
GLOVE BIOGEL PI INDICATOR 8 (GLOVE) ×1
GLOVE SURG SYN 7.5  E (GLOVE) ×1
GLOVE SURG SYN 7.5 E (GLOVE) ×1 IMPLANT
GLOVE SURG SYN 7.5 PF PI (GLOVE) ×1 IMPLANT
GOWN STRL REUS W/ TWL LRG LVL3 (GOWN DISPOSABLE) ×2 IMPLANT
GOWN STRL REUS W/ TWL XL LVL3 (GOWN DISPOSABLE) ×2 IMPLANT
GOWN STRL REUS W/TWL LRG LVL3 (GOWN DISPOSABLE) ×2
GOWN STRL REUS W/TWL XL LVL3 (GOWN DISPOSABLE) ×2
HANDPIECE INTERPULSE COAX TIP (DISPOSABLE)
KIT BASIN OR (CUSTOM PROCEDURE TRAY) ×2 IMPLANT
KIT TURNOVER KIT B (KITS) ×2 IMPLANT
MANIFOLD NEPTUNE II (INSTRUMENTS) ×2 IMPLANT
NDL HYPO 25GX1X1/2 BEV (NEEDLE) IMPLANT
NEEDLE HYPO 25GX1X1/2 BEV (NEEDLE) IMPLANT
NS IRRIG 1000ML POUR BTL (IV SOLUTION) ×2 IMPLANT
PACK ORTHO EXTREMITY (CUSTOM PROCEDURE TRAY) ×2 IMPLANT
PAD ARMBOARD 7.5X6 YLW CONV (MISCELLANEOUS) ×4 IMPLANT
PAD NEG PRESSURE SENSATRAC (MISCELLANEOUS) ×1 IMPLANT
SET CYSTO W/LG BORE CLAMP LF (SET/KITS/TRAYS/PACK) IMPLANT
SET HNDPC FAN SPRY TIP SCT (DISPOSABLE) IMPLANT
SPONGE LAP 18X18 RF (DISPOSABLE) IMPLANT
STOCKINETTE IMPERVIOUS 9X36 MD (GAUZE/BANDAGES/DRESSINGS) ×2 IMPLANT
SUT ETHILON 3 0 PS 1 (SUTURE) IMPLANT
SUT PDS AB 2-0 CT1 27 (SUTURE) IMPLANT
SYR CONTROL 10ML LL (SYRINGE) IMPLANT
TOWEL GREEN STERILE (TOWEL DISPOSABLE) ×2 IMPLANT
TOWEL GREEN STERILE FF (TOWEL DISPOSABLE) ×2 IMPLANT
TUBE CONNECTING 12X1/4 (SUCTIONS) ×2 IMPLANT
UNDERPAD 30X36 HEAVY ABSORB (UNDERPADS AND DIAPERS) ×2 IMPLANT
YANKAUER SUCT BULB TIP NO VENT (SUCTIONS) ×2 IMPLANT

## 2020-06-28 NOTE — Progress Notes (Signed)
Inpatient Diabetes Program Recommendations  AACE/ADA: New Consensus Statement on Inpatient Glycemic Control   Target Ranges:  Prepandial:   less than 140 mg/dL      Peak postprandial:   less than 180 mg/dL (1-2 hours)      Critically ill patients:  140 - 180 mg/dL   Results for Clinton Atkinson, Clinton Atkinson (MRN [id number]) as of 06/28/2020 14:09  Ref. Range 06/28/2020 07:04 06/28/2020 09:39 06/28/2020 11:36 06/28/2020 11:51  Glucose-Capillary Latest Ref Range: 70 - 99 mg/dL 176 (H)  Novolog 11 units 312 (H)  Novolog 10 uints 202 (H) 236 (H)  Novolog 5 units  Results for Clinton Atkinson, Clinton Atkinson (MRN [id number]) as of 06/28/2020 14:09  Ref. Range 06/27/2020 17:28 06/27/2020 21:24  Glucose-Capillary Latest Ref Range: 70 - 99 mg/dL 269 (HH)  Novolog 15 units 230 (H)  Novolog 2 units  Lantus 35 units   Review of Glycemic Control  Diabetes history: DM2 Outpatient Diabetes medications: Lantus 35 units BID, Januvia 100 mg daily Current orders for Inpatient glycemic control: Lantus 35 units BID, Novolog 0-15 units TID with meals, Novolog 0-5 units QHS, Tradjenta 5 mg daily  Inpatient Diabetes Program Recommendations:    Insulin: Noted 10 am dose of Lantus was not given this morning as patient was in procedure.  NOTE: Noted consult for Diabetes Coordinator. Diabetes Coordinator is not on campus over the weekend but available by pager from 8am to 5pm for questions or concerns. Chart reviewed. Noted office visit note by Dr. Shirleen Schirmer (with [facility]) on 04/03/20 which indicates that patient was prescribed Humalog 20 units before each meal, Lantus 50 units at bedtime, and Januvia 100 mg daily for outpatient DM medications. Tried to call patient's room phone several times to confirm outpatient DM medications but phone busy. Will continue to follow along while inpatient.  Thanks, Orlando Penner, RN, MSN, CDE Diabetes Coordinator Inpatient Diabetes Program [phone] (Team Pager from 8am to 5pm)

## 2020-06-28 NOTE — ED Notes (Signed)
Handoff report finally given to [employer].
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
## 2020-06-28 NOTE — ED Notes (Signed)
RN made multiple attempts to call report to receiving RN at [facility] and placed on hold or transferred multiple times without success.

## 2020-06-28 NOTE — Consult Note (Signed)
   ORTHOPAEDIC CONSULTATION  REQUESTING PHYSICIAN: Pahwani, Ravi, MD  Chief Complaint: left ankle wound   HPI: Clinton [facility] is a 54 y.o. male with medical history of type 2 diabetes mellitus, insulin-dependent, CAD, hypertension, hyperlipidemia. He came to [facility] due to a non-healing wound on his left ankle. He believes he was bit by a spider over a week ago. His PCP treated him with oral antibiotics which did not help. The black eschar caused him to come to the ED. Orthopedics was consulted for evaluation.  Upon examination, patient sleep in hospital bed. No acute distress. Saying he has pain in left ankle and left foot. He has diabetic neuropathy with uncontrolled diabetes. Glucose levels were 584 in the ED. He denies any pain besides left lower extremity. Previous transmetatarsal amputation of right foot by Dr. Loti (sp?) In [place]. He said it started off as a wound on the bottom of his foot. He believes he was treated wrong for this. He thought about suing. He complains about the doctors in [place] except for Dr. Loti (sp?). He says he walks with a cane occasionally but not all the time. He is already asking to go home.  Past Medical History:  Diagnosis Date  . COPD (chronic obstructive pulmonary disease) (HCC)   . Diabetes (HCC)   . Diabetes (HCC) 10/06/2016  . GERD (gastroesophageal reflux disease)   . GERD (gastroesophageal reflux disease) 10/06/2016  . High cholesterol 10/06/2016  . Hyperlipemia   . Neuropathy    Past Surgical History:  Procedure Laterality Date  . APPENDECTOMY    . EXPLORATORY LAPAROTOMY    . FOOT AMPUTATION    . UMBILICAL HERNIA REPAIR    . UPPER GASTROINTESTINAL ENDOSCOPY     Social History   Socioeconomic History  . Marital status: Single    Spouse name: Not on file  . Number of children: Not on file  . Years of education: Not on file  . Highest education level: Not on file  Occupational History  . Not on file   Tobacco Use  . Smoking status: Current Every Day Smoker    Packs/day: 1.00    Types: Cigarettes  . Smokeless tobacco: Never Used  Substance and Sexual Activity  . Alcohol use: Yes    Comment: vodka  . Drug use: No  . Sexual activity: Not on file  Other Topics Concern  . Not on file  Social History Narrative  . Not on file   Social Determinants of Health   Financial Resource Strain:   . Difficulty of Paying Living Expenses: Not on file  Food Insecurity:   . Worried About Running Out of Food in the Last Year: Not on file  . Ran Out of Food in the Last Year: Not on file  Transportation Needs:   . Lack of Transportation (Medical): Not on file  . Lack of Transportation (Non-Medical): Not on file  Physical Activity:   . Days of Exercise per Week: Not on file  . Minutes of Exercise per Session: Not on file  Stress:   . Feeling of Stress : Not on file  Social Connections:   . Frequency of Communication with Friends and Family: Not on file  . Frequency of Social Gatherings with Friends and Family: Not on file  . Attends Religious Services: Not on file  . Active Member of Clubs or Organizations: Not on file  . Attends Club or Organization Meetings: Not on file  . Marital Status: Not   on file   No family history on file. Allergies  Allergen Reactions  . Doxycycline Hyclate Diarrhea  . Metformin Diarrhea    Stomach cramp/inability to eat  . Ultram [Tramadol] Itching   Prior to Admission medications   Medication Sig Start Date End Date Taking? Authorizing Provider  albuterol (VENTOLIN HFA) 108 (90 Base) MCG/ACT inhaler Inhale 2 puffs into the lungs every 6 (six) hours as needed for wheezing or shortness of breath.   Yes [provider]  amitriptyline (ELAVIL) 25 MG tablet Take 50 mg by mouth at bedtime. 06/13/20  Yes [provider]  cephALEXin (KEFLEX) 500 MG capsule Take 500 mg by mouth 4 (four) times daily. 06/19/20  Yes [provider]  cetirizine  (ZYRTEC) 10 MG tablet Take 10 mg by mouth every evening.   Yes [provider]  DULoxetine (CYMBALTA) 60 MG capsule Take 30 mg by mouth daily.   Yes [provider]  gabapentin (NEURONTIN) 800 MG tablet Take 800 mg by mouth 3 (three) times daily.   Yes [provider]  ibuprofen (ADVIL) 800 MG tablet Take 800 mg by mouth 3 (three) times daily.   Yes [provider]  Insulin Glargine (LANTUS SOLOSTAR) 100 UNIT/ML Solostar Pen Inject 35 Units into the skin 2 (two) times daily.   Yes [provider]  ketorolac (TORADOL) 10 MG tablet Take 10 mg by mouth 4 (four) times daily as needed. 06/21/20  Yes [provider]  lisinopril (PRINIVIL,ZESTRIL) 2.5 MG tablet Take 2.5 mg by mouth every evening.    Yes [provider]  losartan (COZAAR) 25 MG tablet Take 25 mg by mouth daily. 06/24/20  Yes [provider]  Multiple Vitamin (MULTIVITAMIN WITH MINERALS) TABS tablet Take 1 tablet by mouth every evening.   Yes [provider]  nitroGLYCERIN (NITROSTAT) 0.4 MG SL tablet Place 0.4 mg under the tongue daily. 06/04/20  Yes [provider]  pantoprazole (PROTONIX) 40 MG tablet Take 1 tablet (40 mg total) by mouth 2 (two) times daily before a meal. 10/06/16  Yes Setzer, Terri L, NP  prasugrel (EFFIENT) 10 MG TABS tablet Take 10 mg by mouth daily.   Yes [provider]  rOPINIRole (REQUIP) 2 MG tablet Take 1 tablet by mouth daily.   Yes [provider]  rosuvastatin (CRESTOR) 20 MG tablet Take 20 mg by mouth every evening. 09/29/16  Yes [provider]  sitaGLIPtin (JANUVIA) 100 MG tablet Take 100 mg by mouth every evening.    Yes [provider]  tamsulosin (FLOMAX) 0.4 MG CAPS capsule Take 0.4 mg by mouth daily.   Yes [provider]  Vitamin D, Cholecalciferol, 25 MCG (1000 UT) CAPS Take 1 capsule by mouth daily.   Yes [provider]  zonisamide (ZONEGRAN) 25 MG capsule Take 25  mg by mouth See admin instructions. Take 1 capsules by mouth 1 week, then take 2 capsules at bedtime for 1 week,take 3 capsules at bedtime. 06/09/20  Yes [provider]   DG Ankle Complete Left  Result Date: 06/27/2020 CLINICAL DATA:  Ulcerated area to lateral Lt ankle after spider bite x 8 days ago. Hx diabetes EXAM: LEFT ANKLE COMPLETE - 3+ VIEW COMPARISON:  None. FINDINGS: There is no evidence of fracture, dislocation, or joint effusion. There is no focal bone lesion or osseous destruction. Lateral soft tissue defect likely representing known ulceration. IMPRESSION: No radiographic evidence of osteomyelitis in the left ankle. Electronically Signed   By: Emmaline Kluver  M.D.   On: 06/27/2020 16:48   MR ANKLE LEFT W WO CONTRAST  Result Date: 06/27/2020 CLINICAL DATA:  Bitten by a spider left ankle wound and swelling EXAM: MRI OF THE LEFT ANKLE WITHOUT AND WITH CONTRAST TECHNIQUE: Multiplanar, multisequence MR imaging of the ankle was performed before and after the administration of intravenous contrast. CONTRAST:  36mL GADAVIST GADOBUTROL 1 MMOL/ML IV SOLN COMPARISON:  None. FINDINGS: TENDONS Peroneal: A small amount of fluid is seen surrounding the peroneal brevis and longus tendons, however they are intact. Posteromedial: Posterior tibial tendon intact. Flexor hallucis longus tendon intact. Flexor digitorum longus tendon intact. Anterior: Tibialis anterior tendon intact. Extensor hallucis longus tendon intact Extensor digitorum longus tendon intact. Achilles: Mildly increased signal seen within the Achilles tendon insertion site with a tiny amount of retrocalcaneal bursal fluid, however it is intact. Plantar Fascia: Intact. LIGAMENTS Lateral: Anterior talofibular ligament intact. Calcaneofibular ligament intact. Posterior talofibular ligament intact. Anterior and posterior tibiofibular ligaments intact. Medial: Increased heterogeneous signal seen within the deltoid ligament, however it is  intact. Spring ligament intact. CARTILAGE Ankle Joint: Trace joint effusion. Normal ankle mortise. No chondral defect. Subtalar Joints/Sinus Tarsi: Normal subtalar joints. No subtalar joint effusion. Mild edema seen within the sinus tarsi. Bones: There is increased T2 hyperintense signal with minimal T1 hypointensity at the distal fibular tip with enhancement. No areas of cortical destruction, however or periosteal reaction. No osseous fracture. Soft Tissue: There is an overlying area of ulceration seen overlying the lateral aspect of the ankle at the distal fibular tip measuring 2.1 cm in dimension with a multilocular fluid collection seen within the subcutaneous soft tissues measuring approximately a 2.7 x 0.5 x 3.1 cm in dimension. There is significant overlying subcutaneous edema noted. The fluid collection does extend to the posterior retrocalcaneal fat pad. IMPRESSION: IMPRESSION Large area of ulceration seen overlying the lateral aspect of the ankle at the distal fibular tip with a multilocular soft tissue abscess measuring 2.7 x 0.5 x 3.1 cm. There is significant surrounding subcutaneous edema which extends to the retrocalcaneal fat pad. Findings which are suggestive of reactive marrow versus early osteomyelitis of the distal fibular tip. No osseous fracture however seen. Mild peroneal tenosynovitis. Insertional Achilles tendinosis Electronically Signed   By: Jonna Clark M.D.   On: 06/27/2020 19:26   Family History Reviewed and non-contributory, no pertinent history of problems with bleeding or anesthesia      Review of Systems 14 system ROS conducted and negative except for that noted in HPI   OBJECTIVE  Vitals: Patient Vitals for the past 8 hrs:  BP Temp Temp src Pulse Resp SpO2  06/28/20 0349 138/86 (!) 97.4 F (36.3 C) Oral 91 16 99 %   General: Alert, no acute distress Cardiovascular: Warm extremities noted Respiratory: No cyanosis, no use of accessory musculature GI: No  organomegaly, abdomen is soft and non-tender Skin: No lesions in the area of chief complaint other than those listed below in MSK exam.  Neurologic: Sensation intact distally save for the below mentioned MSK exam Psychiatric: Patient is competent for consent with normal mood and affect Lymphatic: No swelling obvious and reported other than the area involved in the exam below  Extremities  Bilateral upper extremities: actively moves upper extremities without pain. NVI RLE: actively moves RLE without pain. Non-tender throughout. Status post right foot TMA LLE: non-tender left hip and left knee. Tender to palpation left ankle and foot. Reports pain with ROM of left ankle. 3 cm eschar on the lateral  aspect of the left ankle with areas of purulence superiorly. Surrounded by erythema. Diabetic wound/callus on the ball of the patient's left foot which is tender. Not open, no drainage, no surrounding erythema. Fungal infection of the toenails. Edema of the foot. Altered sensory function.     Test Results Imaging MRI left ankle showing: "Large area of ulceration seen overlying the lateral aspect of the ankle at the distal fibular tip with a multilocular soft tissue abscess measuring 2.7 x 0.5 x 3.1 cm. There is significant surrounding subcutaneous edema which extends to the retrocalcaneal fat pad. Findings which are suggestive of reactive marrow versus early osteomyelitis of the distal fibular tip. No osseous fracture however seen."  Labs cbc Recent Labs    06/27/20 1543  WBC 12.5*  HGB 17.0  HCT 50.1  PLT 302    Labs inflam Recent Labs    06/27/20 1913  CRP 2.6*    Labs coag No results for input(s): INR, PTT in the last 72 hours.  Invalid input(s): PT  Recent Labs    06/27/20 1543  NA 131*  K 3.8  CL 97*  CO2 23  GLUCOSE 584*  BUN 18  CREATININE 0.83  CALCIUM 9.2     ASSESSMENT AND PLAN: 54 y.o. male with the following: Left ankle wound  Orthopedics recommends admission  to a medical At[facility] and we will provide consultation and follow along. Patient has been started on broad-spectrum antibiotics per medicine team.  The risks benefits and alternatives were discussed with the patient including but not limited to the risks of nonoperative treatment, versus surgical intervention including infection, bleeding, nerve injury, the need for additional surgery, blood clots, cardiopulmonary complications, morbidity, mortality, among others, and they were willing to proceed.    - Plan: Irrigation and debridement of left ankle wound in the OR this morning with Dr. Eulah Pont - Keep NPO - Weight Bearing Status/Activity: NWB for now - Additional recommended labs/tests: None - VTE Prophylaxis: per medicine - Pain control: PRN pain medications - Follow-up plan: TBD    Alfonse Alpers, PA-C 06/28/2020

## 2020-06-28 NOTE — ED Notes (Signed)
[facility] present to provide transport to [facility]

## 2020-06-28 NOTE — Transfer of Care (Signed)
Immediate Anesthesia Transfer of Care Note  Patient: Clinton Atkinson  Procedure(s) Performed: IRRIGATION AND DEBRIDEMENT EXTREMITY (Left Ankle) APPLICATION OF WOUND VAC (Left Ankle)  Patient Location: PACU  Anesthesia Type:General  Level of Consciousness: drowsy and patient cooperative  Airway & Oxygen Therapy: Patient Spontanous Breathing and Patient connected to nasal cannula oxygen  Post-op Assessment: Report given to RN, Post -op Vital signs reviewed and stable and Patient moving all extremities  Post vital signs: Reviewed and stable  Last Vitals:  Vitals Value Taken Time  BP 82/59 06/28/20 0937  Temp    Pulse 106 06/28/20 0937  Resp 18 06/28/20 0938  SpO2 90 % 06/28/20 0937  Vitals shown include unvalidated device data.  Last Pain:  Vitals:   06/28/20 0754  TempSrc: Oral  PainSc: Asleep         Complications: No complications documented.

## 2020-06-28 NOTE — Plan of Care (Signed)
  Problem: Activity: Goal: Risk for activity intolerance will decrease Outcome: Progressing   Problem: Pain Managment: Goal: General experience of comfort will improve Outcome: Progressing   

## 2020-06-28 NOTE — Anesthesia Preprocedure Evaluation (Addendum)
Anesthesia Evaluation  Patient identified by MRN, date of birth, ID band Patient awake    Reviewed: Allergy & Precautions, NPO status , Patient's Chart, lab work & pertinent test results  Airway Mallampati: I  TM Distance: >3 FB Neck ROM: Full    Dental  (+) Edentulous Upper, Edentulous Lower   Pulmonary COPD,  COPD inhaler, Current Smoker,     + decreased breath sounds      Cardiovascular hypertension, Pt. on medications  Rhythm:Regular Rate:Normal     Neuro/Psych negative neurological ROS  negative psych ROS   GI/Hepatic Neg liver ROS, GERD  Medicated,  Endo/Other  diabetes, Type 2, Insulin Dependent, Oral Hypoglycemic Agents  Renal/GU negative Renal ROS     Musculoskeletal negative musculoskeletal ROS (+)   Abdominal Normal abdominal exam  (+)   Peds  Hematology negative hematology ROS (+)   Anesthesia Other Findings   Reproductive/Obstetrics                            Anesthesia Physical Anesthesia Plan  ASA: III  Anesthesia Plan: General   Post-op Pain Management:    Induction: Intravenous  PONV Risk Score and Plan: 2 and Ondansetron and Midazolam  Airway Management Planned: LMA  Additional Equipment: None  Intra-op Plan:   Post-operative Plan: Extubation in OR  Informed Consent: I have reviewed the patients History and Physical, chart, labs and discussed the procedure including the risks, benefits and alternatives for the proposed anesthesia with the patient or authorized representative who has indicated his/her understanding and acceptance.     Dental advisory given  Plan Discussed with: CRNA  Anesthesia Plan Comments: (Na improved on istat. Will recheck CBG in PACU. 11U SQ administered on floor for 330.)       Anesthesia Quick Evaluation

## 2020-06-28 NOTE — Progress Notes (Signed)
Pt has displayed the ability to turn off bed alarm, as witnessed by this RN. Pt educated on the need for the bed alarm and the need for pt to ask for help when getting out of bed. Pt verbalized understanding, however this informations needs reenforcement. Will continue to monitor and educate.

## 2020-06-28 NOTE — Interval H&P Note (Signed)
History and Physical Interval Note:  06/28/2020 7:29 AM  Clinton Atkinson  has presented today for surgery, with the diagnosis of ABSCESS LEFT LEG.  The various methods of treatment have been discussed with the patient and family. After consideration of risks, benefits and other options for treatment, the patient has consented to  Procedure(s): IRRIGATION AND DEBRIDEMENT EXTREMITY (Left) as a surgical intervention.  The patient's history has been reviewed, patient examined, no change in status, stable for surgery.  I have reviewed the patient's chart and labs.  Questions were answered to the patient's satisfaction.     Sheral Apley

## 2020-06-28 NOTE — Plan of Care (Signed)

## 2020-06-28 NOTE — H&P (View-Only) (Signed)
ORTHOPAEDIC CONSULTATION  REQUESTING PHYSICIAN: Hughie ClossPahwani, Ravi, MD  Chief Complaint: left ankle wound   HPI: Clinton CoeRussell Atkinson is a 54 y.o. male with medical history of type 2 diabetes mellitus, insulin-dependent, CAD, hypertension, hyperlipidemia. He came to [facility] due to a non-healing wound on his left ankle. He believes he was bit by a spider over a week ago. His PCP treated him with oral antibiotics which did not help. The black eschar caused him to come to the ED. Orthopedics was consulted for evaluation.  Upon examination, patient sleep in hospital bed. No acute distress. Saying he has pain in left ankle and left foot. He has diabetic neuropathy with uncontrolled diabetes. Glucose levels were 584 in the ED. He denies any pain besides left lower extremity. Previous transmetatarsal amputation of right foot by Dr. Carleene OverlieLoti (sp?) [place]. He said it started off as a wound on the bottom of his foot. He believes he was treated wrong for this. He thought about suing. He complains about the doctors in [place] except for Dr. Carleene OverlieLoti (sp?). He says he walks with a cane occasionally but not all the time. He is already asking to go home.  Past Medical History:  Diagnosis Date  . COPD (chronic obstructive pulmonary disease) (HCC)   . Diabetes (HCC)   . Diabetes (HCC) 10/06/2016  . GERD (gastroesophageal reflux disease)   . GERD (gastroesophageal reflux disease) 10/06/2016  . High cholesterol 10/06/2016  . Hyperlipemia   . Neuropathy    Past Surgical History:  Procedure Laterality Date  . APPENDECTOMY    . EXPLORATORY LAPAROTOMY    . FOOT AMPUTATION    . UMBILICAL HERNIA REPAIR    . UPPER GASTROINTESTINAL ENDOSCOPY     Social History   Socioeconomic History  . Marital status: Single    Spouse name: Not on file  . Number of children: Not on file  . Years of education: Not on file  . Highest education level: Not on file  Occupational History  . Not on file   Tobacco Use  . Smoking status: Current Every Day Smoker    Packs/day: 1.00    Types: Cigarettes  . Smokeless tobacco: Never Used  Substance and Sexual Activity  . Alcohol use: Yes    Comment: vodka  . Drug use: No  . Sexual activity: Not on file  Other Topics Concern  . Not on file  Social History Narrative  . Not on file   Social Determinants of Health   Financial Resource Strain:   . Difficulty of Paying Living Expenses: Not on file  Food Insecurity:   . Worried About Programme researcher, broadcasting/film/videounning Out of Food in the Last Year: Not on file  . Ran Out of Food in the Last Year: Not on file  Transportation Needs:   . Lack of Transportation (Medical): Not on file  . Lack of Transportation (Non-Medical): Not on file  Physical Activity:   . Days of Exercise per Week: Not on file  . Minutes of Exercise per Session: Not on file  Stress:   . Feeling of Stress : Not on file  Social Connections:   . Frequency of Communication with Friends and Family: Not on file  . Frequency of Social Gatherings with Friends and Family: Not on file  . Attends Religious Services: Not on file  . Active Member of Clubs or Organizations: Not on file  . Attends BankerClub or Organization Meetings: Not on file  . Marital Status: Not  on file   No family history on file. Allergies  Allergen Reactions  . Doxycycline Hyclate Diarrhea  . Metformin Diarrhea    Stomach cramp/inability to eat  . Ultram [Tramadol] Itching   Prior to Admission medications   Medication Sig Start Date End Date Taking? Authorizing Provider  albuterol (VENTOLIN HFA) 108 (90 Base) MCG/ACT inhaler Inhale 2 puffs into the lungs every 6 (six) hours as needed for wheezing or shortness of breath.   Yes [provider]  amitriptyline (ELAVIL) 25 MG tablet Take 50 mg by mouth at bedtime. 06/13/20  Yes [provider]  cephALEXin (KEFLEX) 500 MG capsule Take 500 mg by mouth 4 (four) times daily. 06/19/20  Yes [provider]  cetirizine  (ZYRTEC) 10 MG tablet Take 10 mg by mouth every evening.   Yes [provider]  DULoxetine (CYMBALTA) 60 MG capsule Take 30 mg by mouth daily.   Yes [provider]  gabapentin (NEURONTIN) 800 MG tablet Take 800 mg by mouth 3 (three) times daily.   Yes [provider]  ibuprofen (ADVIL) 800 MG tablet Take 800 mg by mouth 3 (three) times daily.   Yes [provider]  Insulin Glargine (LANTUS SOLOSTAR) 100 UNIT/ML Solostar Pen Inject 35 Units into the skin 2 (two) times daily.   Yes [provider]  ketorolac (TORADOL) 10 MG tablet Take 10 mg by mouth 4 (four) times daily as needed. 06/21/20  Yes [provider]  lisinopril (PRINIVIL,ZESTRIL) 2.5 MG tablet Take 2.5 mg by mouth every evening.    Yes [provider]  losartan (COZAAR) 25 MG tablet Take 25 mg by mouth daily. 06/24/20  Yes [provider]  Multiple Vitamin (MULTIVITAMIN WITH MINERALS) TABS tablet Take 1 tablet by mouth every evening.   Yes [provider]  nitroGLYCERIN (NITROSTAT) 0.4 MG SL tablet Place 0.4 mg under the tongue daily. 06/04/20  Yes [provider]  pantoprazole (PROTONIX) 40 MG tablet Take 1 tablet (40 mg total) by mouth 2 (two) times daily before a meal. 10/06/16  Yes Setzer, Terri L, NP  prasugrel (EFFIENT) 10 MG TABS tablet Take 10 mg by mouth daily.   Yes [provider]  rOPINIRole (REQUIP) 2 MG tablet Take 1 tablet by mouth daily.   Yes [provider]  rosuvastatin (CRESTOR) 20 MG tablet Take 20 mg by mouth every evening. 09/29/16  Yes [provider]  sitaGLIPtin (JANUVIA) 100 MG tablet Take 100 mg by mouth every evening.    Yes [provider]  tamsulosin (FLOMAX) 0.4 MG CAPS capsule Take 0.4 mg by mouth daily.   Yes [provider]  Vitamin D, Cholecalciferol, 25 MCG (1000 UT) CAPS Take 1 capsule by mouth daily.   Yes [provider]  zonisamide (ZONEGRAN) 25 MG capsule Take 25  mg by mouth See admin instructions. Take 1 capsules by mouth 1 week, then take 2 capsules at bedtime for 1 week,take 3 capsules at bedtime. 06/09/20  Yes [provider]   DG Ankle Complete Left  Result Date: 06/27/2020 CLINICAL DATA:  Ulcerated area to lateral Lt ankle after spider bite x 8 days ago. Hx diabetes EXAM: LEFT ANKLE COMPLETE - 3+ VIEW COMPARISON:  None. FINDINGS: There is no evidence of fracture, dislocation, or joint effusion. There is no focal bone lesion or osseous destruction. Lateral soft tissue defect likely representing known ulceration. IMPRESSION: No radiographic evidence of osteomyelitis in the left ankle. Electronically Signed   By: Emmaline Kluver  M.D.   On: 06/27/2020 16:48   MR ANKLE LEFT W WO CONTRAST  Result Date: 06/27/2020 CLINICAL DATA:  Bitten by a spider left ankle wound and swelling EXAM: MRI OF THE LEFT ANKLE WITHOUT AND WITH CONTRAST TECHNIQUE: Multiplanar, multisequence MR imaging of the ankle was performed before and after the administration of intravenous contrast. CONTRAST:  36mL GADAVIST GADOBUTROL 1 MMOL/ML IV SOLN COMPARISON:  None. FINDINGS: TENDONS Peroneal: A small amount of fluid is seen surrounding the peroneal brevis and longus tendons, however they are intact. Posteromedial: Posterior tibial tendon intact. Flexor hallucis longus tendon intact. Flexor digitorum longus tendon intact. Anterior: Tibialis anterior tendon intact. Extensor hallucis longus tendon intact Extensor digitorum longus tendon intact. Achilles: Mildly increased signal seen within the Achilles tendon insertion site with a tiny amount of retrocalcaneal bursal fluid, however it is intact. Plantar Fascia: Intact. LIGAMENTS Lateral: Anterior talofibular ligament intact. Calcaneofibular ligament intact. Posterior talofibular ligament intact. Anterior and posterior tibiofibular ligaments intact. Medial: Increased heterogeneous signal seen within the deltoid ligament, however it is  intact. Spring ligament intact. CARTILAGE Ankle Joint: Trace joint effusion. Normal ankle mortise. No chondral defect. Subtalar Joints/Sinus Tarsi: Normal subtalar joints. No subtalar joint effusion. Mild edema seen within the sinus tarsi. Bones: There is increased T2 hyperintense signal with minimal T1 hypointensity at the distal fibular tip with enhancement. No areas of cortical destruction, however or periosteal reaction. No osseous fracture. Soft Tissue: There is an overlying area of ulceration seen overlying the lateral aspect of the ankle at the distal fibular tip measuring 2.1 cm in dimension with a multilocular fluid collection seen within the subcutaneous soft tissues measuring approximately a 2.7 x 0.5 x 3.1 cm in dimension. There is significant overlying subcutaneous edema noted. The fluid collection does extend to the posterior retrocalcaneal fat pad. IMPRESSION: IMPRESSION Large area of ulceration seen overlying the lateral aspect of the ankle at the distal fibular tip with a multilocular soft tissue abscess measuring 2.7 x 0.5 x 3.1 cm. There is significant surrounding subcutaneous edema which extends to the retrocalcaneal fat pad. Findings which are suggestive of reactive marrow versus early osteomyelitis of the distal fibular tip. No osseous fracture however seen. Mild peroneal tenosynovitis. Insertional Achilles tendinosis Electronically Signed   By: Jonna Clark M.D.   On: 06/27/2020 19:26   Family History Reviewed and non-contributory, no pertinent history of problems with bleeding or anesthesia      Review of Systems 14 system ROS conducted and negative except for that noted in HPI   OBJECTIVE  Vitals: Patient Vitals for the past 8 hrs:  BP Temp Temp src Pulse Resp SpO2  06/28/20 0349 138/86 (!) 97.4 F (36.3 C) Oral 91 16 99 %   General: Alert, no acute distress Cardiovascular: Warm extremities noted Respiratory: No cyanosis, no use of accessory musculature GI: No  organomegaly, abdomen is soft and non-tender Skin: No lesions in the area of chief complaint other than those listed below in MSK exam.  Neurologic: Sensation intact distally save for the below mentioned MSK exam Psychiatric: Patient is competent for consent with normal mood and affect Lymphatic: No swelling obvious and reported other than the area involved in the exam below  Extremities  Bilateral upper extremities: actively moves upper extremities without pain. NVI RLE: actively moves RLE without pain. Non-tender throughout. Status post right foot TMA LLE: non-tender left hip and left knee. Tender to palpation left ankle and foot. Reports pain with ROM of left ankle. 3 cm eschar on the lateral  aspect of the left ankle with areas of purulence superiorly. Surrounded by erythema. Diabetic wound/callus on the ball of the patient's left foot which is tender. Not open, no drainage, no surrounding erythema. Fungal infection of the toenails. Edema of the foot. Altered sensory function.     Test Results Imaging MRI left ankle showing: "Large area of ulceration seen overlying the lateral aspect of the ankle at the distal fibular tip with a multilocular soft tissue abscess measuring 2.7 x 0.5 x 3.1 cm. There is significant surrounding subcutaneous edema which extends to the retrocalcaneal fat pad. Findings which are suggestive of reactive marrow versus early osteomyelitis of the distal fibular tip. No osseous fracture however seen."  Labs cbc Recent Labs    06/27/20 1543  WBC 12.5*  HGB 17.0  HCT 50.1  PLT 302    Labs inflam Recent Labs    06/27/20 1913  CRP 2.6*    Labs coag No results for input(s): INR, PTT in the last 72 hours.  Invalid input(s): PT  Recent Labs    06/27/20 1543  NA 131*  K 3.8  CL 97*  CO2 23  GLUCOSE 584*  BUN 18  CREATININE 0.83  CALCIUM 9.2     ASSESSMENT AND PLAN: 54 y.o. male with the following: Left ankle wound  Orthopedics recommends admission  to a medical service and we will provide consultation and follow along. Patient has been started on broad-spectrum antibiotics per medicine team.  The risks benefits and alternatives were discussed with the patient including but not limited to the risks of nonoperative treatment, versus surgical intervention including infection, bleeding, nerve injury, the need for additional surgery, blood clots, cardiopulmonary complications, morbidity, mortality, among others, and they were willing to proceed.    - Plan: Irrigation and debridement of left ankle wound in the OR this morning with Dr. Eulah Pont - Keep NPO - Weight Bearing Status/Activity: NWB for now - Additional recommended labs/tests: None - VTE Prophylaxis: per medicine - Pain control: PRN pain medications - Follow-up plan: TBD    Alfonse Alpers, PA-C 06/28/2020

## 2020-06-28 NOTE — Anesthesia Postprocedure Evaluation (Signed)
Anesthesia Post Note  Patient: Clinton Atkinson  Procedure(s) Performed: IRRIGATION AND DEBRIDEMENT EXTREMITY (Left Ankle) APPLICATION OF WOUND VAC (Left Ankle)     Patient location during evaluation: PACU Anesthesia Type: General Level of consciousness: awake and alert Pain management: pain level controlled Vital Signs Assessment: post-procedure vital signs reviewed and stable Respiratory status: spontaneous breathing, nonlabored ventilation, respiratory function stable and patient connected to nasal cannula oxygen Cardiovascular status: blood pressure returned to baseline and stable Postop Assessment: no apparent nausea or vomiting Anesthetic complications: no   No complications documented.  Last Vitals:  Vitals:   06/28/20 1153 06/28/20 1156  BP:  90/68  Pulse:  85  Resp:  18  Temp:  (!) 36.3 C  SpO2: 91% 98%    Last Pain:  Vitals:   06/28/20 1205  TempSrc:   PainSc: 0-No pain                 Shelton Silvas

## 2020-06-28 NOTE — Anesthesia Procedure Notes (Addendum)
Procedure Name: LMA Insertion Date/Time: 06/28/2020 8:56 AM Performed by: Shireen Quan, CRNA Pre-anesthesia Checklist: Patient identified, Emergency Drugs available, Suction available and Patient being monitored Patient Re-evaluated:Patient Re-evaluated prior to induction Oxygen Delivery Method: Circle System Utilized Preoxygenation: Pre-oxygenation with 100% oxygen Induction Type: IV induction Ventilation: Mask ventilation without difficulty LMA: LMA inserted LMA Size: 5.0 Number of attempts: 1 Placement Confirmation: positive ETCO2 Tube secured with: Tape Dental Injury: Teeth and Oropharynx as per pre-operative assessment

## 2020-06-29 ENCOUNTER — Encounter (HOSPITAL_COMMUNITY): Payer: Self-pay | Admitting: Orthopedic Surgery

## 2020-06-29 DIAGNOSIS — L089 Local infection of the skin and subcutaneous tissue, unspecified: Secondary | ICD-10-CM | POA: Diagnosis not present

## 2020-06-29 DIAGNOSIS — T148XXA Other injury of unspecified body region, initial encounter: Secondary | ICD-10-CM | POA: Diagnosis not present

## 2020-06-29 LAB — CBC
HCT: 38.5 % — ABNORMAL LOW (ref 39.0–52.0)
Hemoglobin: 12.9 g/dL — ABNORMAL LOW (ref 13.0–17.0)
MCH: 30.4 pg (ref 26.0–34.0)
MCHC: 33.5 g/dL (ref 30.0–36.0)
MCV: 90.8 fL (ref 80.0–100.0)
Platelets: 244 10*3/uL (ref 150–400)
RBC: 4.24 MIL/uL (ref 4.22–5.81)
RDW: 12.7 % (ref 11.5–15.5)
WBC: 13.4 10*3/uL — ABNORMAL HIGH (ref 4.0–10.5)
nRBC: 0 % (ref 0.0–0.2)

## 2020-06-29 LAB — BASIC METABOLIC PANEL
Anion gap: 9 (ref 5–15)
BUN: 13 mg/dL (ref 6–20)
CO2: 22 mmol/L (ref 22–32)
Calcium: 8.8 mg/dL — ABNORMAL LOW (ref 8.9–10.3)
Chloride: 103 mmol/L (ref 98–111)
Creatinine, Ser: 0.8 mg/dL (ref 0.61–1.24)
GFR calc Af Amer: >60 mL/min (ref >60)
GFR calc non Af Amer: >60 mL/min (ref >60)
Glucose, Bld: 275 mg/dL — ABNORMAL HIGH (ref 70–99)
Potassium: 4.3 mmol/L (ref 3.5–5.1)
Sodium: 134 mmol/L — ABNORMAL LOW (ref 135–145)

## 2020-06-29 LAB — GLUCOSE, CAPILLARY
Glucose-Capillary: 255 mg/dL — ABNORMAL HIGH (ref 70–99)
Glucose-Capillary: 270 mg/dL — ABNORMAL HIGH (ref 70–99)
Glucose-Capillary: 279 mg/dL — ABNORMAL HIGH (ref 70–99)

## 2020-06-29 MED ORDER — LANTUS SOLOSTAR 100 UNIT/ML ~~LOC~~ SOPN: 45.0000 [IU] | PEN_INJECTOR | Freq: Two times a day (BID) | SUBCUTANEOUS | 0 refills | Status: AC

## 2020-06-29 MED ORDER — HYDROCODONE-ACETAMINOPHEN 5-325 MG PO TABS: 1.0000 | ORAL_TABLET | Freq: Four times a day (QID) | ORAL | 0 refills | Status: DC | PRN

## 2020-06-29 MED ORDER — ASPIRIN 81 MG PO CHEW: 81.0000 mg | CHEWABLE_TABLET | Freq: Two times a day (BID) | ORAL | 1 refills | Status: AC

## 2020-06-29 MED ORDER — ENSURE MAX PROTEIN PO LIQD: 11.0000 [oz_av] | Freq: Two times a day (BID) | ORAL | Status: DC

## 2020-06-29 MED ORDER — DOXYCYCLINE MONOHYDRATE 100 MG PO TABS: 100.0000 mg | ORAL_TABLET | Freq: Two times a day (BID) | ORAL | 0 refills | Status: DC

## 2020-06-29 NOTE — Evaluation (Signed)
Occupational Therapy Evaluation Patient Details Name: Clinton Atkinson MRN: [id number] DOB: 01-23-1966 Today's Date: 06/29/2020    History of Present Illness This 54 y.o. admitted with ulcerated wound Lt lateral ankle possibly due to a spider bite.  MRI of Lt ankle showed soft tissue abcess with bone marrow reaction vs. early osteomyelitis.  He underwent and I&D 10/2.   PMH includes:  uncontrolled IDDM, CAD, HTN, Hyperlipidemia, h/o neuropathy, COPD, s/p Transmetatarsal amputation Rt LE    Clinical Impression   Pt admitted with above. He demonstrates the below listed deficits and will benefit from continued OT to maximize safety and independence with BADLs.  Pt presents to OT with decreased balance, decreased activity tolerance, and decreased understanding/compliance with precautions.   He is able to perform ADLs with min guard assist, but does not maintain NWB precautions despite max cues.  Recommend he uses w/c at home (which he says he has) to minimize WBing on the Lt LE.  Recommend tub transfer bench for home use.  OT will follow acutely.       Follow Up Recommendations  No OT follow up;Supervision - Intermittent    Equipment Recommendations  Tub/shower bench    Recommendations for Other Services       Precautions / Restrictions Precautions Precautions: Fall Restrictions Weight Bearing Restrictions: Yes LLE Weight Bearing: Non weight bearing Other Position/Activity Restrictions: No orders in chart, however, PA note from 10/2 indicates NWB Lt LE       Mobility Bed Mobility Overal bed mobility: Independent                Transfers Overall transfer level: Needs assistance Equipment used: Rolling walker (2 wheeled) Transfers: Sit to/from [employer] Sit to Stand: Min guard Stand pivot transfers: Min guard       General transfer comment: Pt instructed in proper use of RW and how to maintain NWB Lt LE, but he insists he is maintaining NWB even when Lt  foot is planted on the floor.  Extensive conversation had with pt re: need for NWB.  He did verbalize understanding and did agree to w/c use in the home     Balance Overall balance assessment: Needs assistance Sitting-balance support: Feet supported Sitting balance-Leahy Scale: Good     Standing balance support: Bilateral upper extremity supported;During functional activity Standing balance-Leahy Scale: Poor Standing balance comment: reliant on UE support                            ADL either performed or assessed with clinical judgement   ADL Overall ADL's : Needs assistance/impaired Eating/Feeding: Independent   Grooming: Wash/dry hands;Wash/dry face;Oral care;Brushing hair;Set up;Sitting   Upper Body Bathing: Set up;Sitting   Lower Body Bathing: Min guard;Sit to/from stand   Upper Body Dressing : Set up;Sitting   Lower Body Dressing: Min guard;Sit to/from stand Lower Body Dressing Details (indicate cue type and reason): Pt instructed how to perform safely while maintaining NWB, and he will maintain NWB for a brief period of time, then place his foot on the floor and insist that he is maintaining NWB, then later says he can't balance unless he is WBing through [employer] Transfer: Min guard;Ambulation;Comfort height toilet;RW Statistician Details (indicate cue type and reason): does not maintain NWB despite max cues  Toileting- Clothing Manipulation and Hygiene: Min guard;Sit to/from stand Toileting - Clothing Manipulation Details (indicate cue type and reason): does not maintain NWB despite max  cues      Functional mobility during ADLs: Min guard;Rolling walker General ADL Comments: extensive discussion had with pt re: need for NWB and safety concerns at home.  Suggested he use a w/c at home and to pull pants over hips by leaning Lt and Rt to avoid standing and WBing.  he did verbally agree to both recommendations      Vision         Perception      Praxis      Pertinent Vitals/Pain Pain Assessment: Faces Faces Pain Scale: Hurts a little bit Pain Location: Lt foot  Pain Descriptors / Indicators: Guarding Pain Intervention(s): Monitored during session     Hand Dominance Right   Extremity/Trunk Assessment Upper Extremity Assessment Upper Extremity Assessment: Overall WFL for tasks assessed   Lower Extremity Assessment Lower Extremity Assessment: Defer to PT evaluation   Cervical / Trunk Assessment Cervical / Trunk Assessment: Normal   Communication Communication Communication: No difficulties   Cognition Arousal/Alertness: Awake/alert Behavior During Therapy: Impulsive Overall Cognitive Status: No family/caregiver present to determine baseline cognitive functioning                                 General Comments: Pt tangential, and easily distracted.  He demonstrates decreased carry over of info that appears to be volitional, and demonstrates poor safety awareness which also appears to be volitional.     General Comments       Exercises     Shoulder Instructions      Home Living Family/patient expects to be discharged to:: Private residence Living Arrangements: Non-relatives/Friends Available Help at Discharge: Personal care attendant Type of Home: House Home Access: Ramped entrance     Home Layout: One level     Bathroom Shower/Tub: Tub/shower unit;Curtain   Bathroom Toilet:  ("just Right" )     Home Equipment: Cane - single point;Walker - 4 wheels   Additional Comments: Pt reports he has a PCA 5 hours/day for 5days/week.  He has a roommate, but reports they don't assist him       Prior Functioning/Environment Level of Independence: Independent with assistive device(s)        Comments: Pt reports he performs ADLs mod I, ambulates with a SPC, does his own grocery shopping, and drives a standar shift vehicle ("I'm not supposed to, but I do it anyway")        OT Problem List:  Decreased activity tolerance;Impaired balance (sitting and/or standing);Decreased safety awareness;Decreased cognition;Decreased knowledge of use of DME or AE;Pain      OT Treatment/Interventions: Self-care/ADL training;Therapeutic exercise;Therapeutic activities;Cognitive remediation/compensation;Patient/family education;Balance training    OT Goals(Current goals can be found in the care plan section) Acute Rehab OT Goals Patient Stated Goal: I need a tub seat, and I need to go home"  OT Goal Formulation: With patient Time For Goal Achievement: 07/13/20 Potential to Achieve Goals: Good ADL Goals Pt Will Perform Lower Body Dressing: with supervision;sit to/from stand Pt Will Transfer to Toilet: with min guard assist;stand pivot transfer;bedside commode;regular height toilet;grab bars Pt Will Perform Toileting - Clothing Manipulation and hygiene: with min guard assist;sit to/from stand;sitting/lateral leans Additional ADL Goal #1: Pt will demonstrate independence with precautions during ADLs  OT Frequency: Min 2X/week   Barriers to D/C:            Co-evaluation              AM-PAC OT "  6 Clicks" Daily Activity     Outcome Measure Help from another person eating meals?: None Help from another person taking care of personal grooming?: A Little Help from another person toileting, which includes using toliet, bedpan, or urinal?: A Little Help from another person bathing (including washing, rinsing, drying)?: A Little Help from another person to put on and taking off regular upper body clothing?: A Little Help from another person to put on and taking off regular lower body clothing?: A Little 6 Click Score: 19   End of Session Equipment Utilized During Treatment: Rolling walker (pt refused use of gait belt ) Nurse Communication: Mobility status  Activity Tolerance: Patient tolerated treatment well Patient left: in bed;with call bell/phone within reach;with bed alarm set  OT  Visit Diagnosis: Unsteadiness on feet (R26.81);Pain Pain - Right/Left: Left Pain - part of body: Ankle and joints of foot                Time: 1749-4496 OT Time Calculation (min): 30 min Charges:  OT General Charges $OT Visit: 1 Visit OT Evaluation $OT Eval Moderate Complexity: 1 Mod OT Treatments $Self Care/Home Management : 8-22 mins  Eber Jones., OTR/L Acute Rehabilitation Services Pager [phone] Office [phone]   Jeani Hawking M 06/29/2020, 11:11 AM

## 2020-06-29 NOTE — Progress Notes (Signed)
Pt was constantly encouraged to asks for help/assistance during ambulation for safety, however has get OOB multiple times unassisted and states " I can do it." Bed alarm in place. Bed in lowest position and encouragement continues. No voiced complaints/ concerns at this time.

## 2020-06-29 NOTE — Progress Notes (Signed)
Initial Nutrition Assessment  DOCUMENTATION CODES:   Not applicable  INTERVENTION:    Ensure Max po BID, each supplement provides 150 kcal and 30 grams of protein.   Continue MVI with minerals daily.  NUTRITION DIAGNOSIS:   Increased nutrient needs related to wound healing as evidenced by estimated needs.  GOAL:   Patient will meet greater than or equal to 90% of their needs  MONITOR:   PO intake, Supplement acceptance, Labs, Skin  REASON FOR ASSESSMENT:   Consult Wound healing  ASSESSMENT:   54 yo male admitted with left leg abscess, spider bite > 1 week PTA. S/P I&D with VAC placement 10/2. PMH includes DM-2, insulin dependent, CAD, HTN, HLD.   Patient is on a CHO modified diet, meal intakes 100%. Discussed with patient the importance of adequate protein intake and good glycemic control to support healing.  Labs reviewed. Sodium 134 CBG: [phone]  Medications reviewed and include colace, fenofibrate, novoLOG, Lantus, Tradjenta, MVI with minerals, Flomax, IV antibiotics.   NUTRITION - FOCUSED PHYSICAL EXAM:  unable to complete, RD working remotely  Diet Order:   Diet Order            Diet Carb Modified Fluid consistency: Thin; Room service appropriate? Yes  Diet effective now                 EDUCATION NEEDS:   Education needs have been addressed  Skin:  Skin Assessment: Skin Integrity Issues: Skin Integrity Issues:: Wound VAC Wound Vac: L leg wound S/P I&D  Last BM:  10/1  Height:   Ht Readings from Last 1 Encounters:  06/27/20 5\' 9"  (1.753 m)    Weight:   Wt Readings from Last 1 Encounters:  06/27/20 79.4 kg    Ideal Body Weight:  72.7 kg  BMI:  Body mass index is 25.84 kg/m.  Estimated Nutritional Needs:   Kcal:  2200-2400  Protein:  120-140 gm  Fluid:  >/= 2.2 L    08/27/20, RD, LDN, CNSC Please refer to Amion for contact information.

## 2020-06-29 NOTE — Discharge Instructions (Signed)
Cellulitis, Adult  Cellulitis is a skin infection. The infected area is usually warm, red, swollen, and tender. This condition occurs most often in the arms and lower legs. The infection can travel to the muscles, blood, and underlying tissue and become serious. It is very important to get treated for this condition. What are the causes? Cellulitis is caused by bacteria. The bacteria enter through a break in the skin, such as a cut, burn, insect bite, open sore, or crack. What increases the risk? This condition is more likely to occur in people who:  Have a weak body defense system (immune system).  Have open wounds on the skin, such as cuts, burns, bites, and scrapes. Bacteria can enter the body through these open wounds.  Are older than 53 years of age.  Have diabetes.  Have a type of long-lasting (chronic) liver disease (cirrhosis) or kidney disease.  Are obese.  Have a skin condition such as: ? Itchy rash (eczema). ? Slow movement of blood in the veins (venous stasis). ? Fluid buildup below the skin (edema).  Have had radiation therapy.  Use IV drugs. What are the signs or symptoms? Symptoms of this condition include:  Redness, streaking, or spotting on the skin.  Swollen area of the skin.  Tenderness or pain when an area of the skin is touched.  Warm skin.  A fever.  Chills.  Blisters. How is this diagnosed? This condition is diagnosed based on a medical history and physical exam. You may also have tests, including:  Blood tests.  Imaging tests. How is this treated? Treatment for this condition may include:  Medicines, such as antibiotic medicines or medicines to treat allergies (antihistamines).  Supportive care, such as rest and application of cold or warm cloths (compresses) to the skin.  Hospital care, if the condition is severe. The infection usually starts to get better within 1-2 days of treatment. Follow these instructions at  home:  Medicines  Take over-the-counter and prescription medicines only as told by your health care provider.  If you were prescribed an antibiotic medicine, take it as told by your health care provider. Do not stop taking the antibiotic even if you start to feel better. General instructions  Drink enough fluid to keep your urine pale yellow.  Do not touch or rub the infected area.  Raise (elevate) the infected area above the level of your heart while you are sitting or lying down.  Apply warm or cold compresses to the affected area as told by your health care provider.  Keep all follow-up visits as told by your health care provider. This is important. These visits let your health care provider make sure a more serious infection is not developing. Contact a health care provider if:  You have a fever.  Your symptoms do not begin to improve within 1-2 days of starting treatment.  Your bone or joint underneath the infected area becomes painful after the skin has healed.  Your infection returns in the same area or another area.  You notice a swollen bump in the infected area.  You develop new symptoms.  You have a general ill feeling (malaise) with muscle aches and pains. Get help right away if:  Your symptoms get worse.  You feel very sleepy.  You develop vomiting or diarrhea that persists.  You notice red streaks coming from the infected area.  Your red area gets larger or turns dark in color. These symptoms may represent a serious problem that is an  emergency. Do not wait to see if the symptoms will go away. Get medical help right away. Call your local emergency services (911 in the [place] Do not drive yourself to the hospital. Summary  Cellulitis is a skin infection. This condition occurs most often in the arms and lower legs.  Treatment for this condition may include medicines, such as antibiotic medicines or antihistamines.  Take over-the-counter and prescription  medicines only as told by your health care provider. If you were prescribed an antibiotic medicine, do not stop taking the antibiotic even if you start to feel better.  Contact a health care provider if your symptoms do not begin to improve within 1-2 days of starting treatment or your symptoms get worse.  Keep all follow-up visits as told by your health care provider. This is important. These visits let your health care provider make sure that a more serious infection is not developing. This information is not intended to replace advice given to you by your health care provider. Make sure you discuss any questions you have with your health care provider.   Wound Vac Therapy Home Guide Negative pressure wound therapy (NPWT) uses a sponge or foam-like material (dressing) placed on or inside the wound. The wound is then covered and sealed with a cover dressing that sticks to your skin (is adhesive). This keeps air out. A tube is attached to the cover dressing, and this tube connects to a small pump. The pump sucks fluid and germs from the wound. NPWT helps to increase blood flow to the wound and heal it from the inside.  LEAVE YOUR DRESSING IN PLACE UNTIL YOUR FOLLOW-UP APPOINTMENT General tips and recommendations If the alarm sounds:  Stay calm.  Do not turn off the pump or do anything with the dressing.  Reasons the alarm may go off: ? The battery is low. Change the battery or plug the device into electrical power. ? The dressing has a leak. Find the leak and put tape over the leak. ? The fluid collection container is full. Change the fluid container.  Call your health care provider right away if you cannot fix the problem.  Explain to your health care provider what is happening. Follow his or her instructions. General instructions  Do not turn off the pump unless told to do so by your health care provider.  Do not turn off the pump for more than 2 hours. If the pump is off for more  than 2 hours, the dressing will need to be changed.  If your health care provider says it is okay to shower: ? Do not take the pump into the shower. ? Make sure the wound dressing is protected and sealed. The wound dressing must stay dry.  Check frequently that the machine indicates that therapy is on and that all clamps are open.  Do not use over-the-counter medicated or antiseptic creams, sprays, liquids, or dressings unless your health care provider approves. Contact a health care provider if:  You have new pain.  You develop irritation, a rash, or itching around the wound or dressing.  You see new black or yellow tissue in your wound.  The dressing changes are painful or cause bleeding.  The pump has been off for more than 2 hours, and you do not know how to change the dressing.  The pump alarm goes off, and you do not know what to do. Get help right away if:  You have a lot of bleeding.  The wound  breaks open.  You have severe pain.  You have signs of infection, such as: ? More redness, swelling, or pain. ? More fluid or blood. ? Warmth. ? Pus or a bad smell. ? Red streaks leading from the wound. ? A fever.  You see a sudden change in the color or texture of the drainage.  You have signs of dehydration, such as: ? Little or no tears, urine, or sweat. ? Muscle cramps. ? Very dry mouth. ? Headache. ? Dizziness. Summary  Negative pressure wound therapy (NPWT) is a device that helps your wound heal.  Set up a clean station for wound care. Your health care provider will tell you what supplies to use.  Follow your health care provider's instructions on how to clean your wound and how to change the dressing.  Contact a health care provider if you have new pain, an irritation, or a rash, or if the alarm goes off and you do not know what to do.  Get help right away if you have a lot of bleeding, your wound breaks open, or you have severe pain. Also, get help if you  have signs of infection. This information is not intended to replace advice given to you by your health care provider. Make sure you discuss any questions you have with your health care provider. Document Revised: 01/05/2019 Document Reviewed: 12/01/2018 Elsevier Patient Education  2020 [employer].

## 2020-06-29 NOTE — Progress Notes (Signed)
   ORTHOPAEDIC PROGRESS NOTE  s/p Procedure(s): IRRIGATION AND DEBRIDEMENT EXTREMITY APPLICATION OF WOUND VAC on 06/28/2020 with Dr. Eulah Pont  SUBJECTIVE: Reports some pain about operative site - improved. No chest pain. No SOB. No nausea/vomiting. No other complaints. Wants to go home  OBJECTIVE: PE: General: alert, no acute distress Left lower extremity: Coban dressing in place - clean, dry. Wound vac in place with good seal. About 20 mL of serosanguinous drainage noted in canister. intact EHL/TA/GSC. Endorses distal sensation. Warm well perfused foot.   Vitals:   06/28/20 1915 06/29/20 0413  BP: (!) 144/90 126/77  Pulse: 89 83  Resp: 16 16  Temp: 97.9 F (36.6 C) 98 F (36.7 C)  SpO2: 98% 94%     ASSESSMENT: Clinton Atkinson is a 54 y.o. male doing well postoperatively. POD#1  PLAN: Weightbearing: WBAT LLE Insicional and dressing care: Dressings left intact until follow-up Orthopedic device(s): Wound QZR:AQTMA 20 mL noted in canister VTE prophylaxis: Lovenox while inpatient. Aspirin 81 mg BID.  Pain control: PRN pain medications, preferring oral medications Follow - up plan: This week with Dr. Carloyn Manner information:  After hours and holidays please check [email or web address] for group call information for [facility]  Patient okay for discharge home if cleared for discharge from primary team. He should follow-up with Dr. Lajoyce Corners this week for evaluation and continued treatment. Will send discharge pain medicine and Aspirin to patient's pharmacy.  Alfonse Alpers, PA-C 06/29/2020

## 2020-06-29 NOTE — Evaluation (Addendum)
Physical Therapy Evaluation Patient Details Name: Clinton Atkinson MRN: [id number] DOB: 1966-02-14 Today's Date: 06/29/2020   History of Present Illness  This 54 y.o. admitted with ulcerated wound Lt lateral ankle possibly due to a spider bite.  MRI of Lt ankle showed soft tissue abcess with bone marrow reaction vs. early osteomyelitis.  He underwent and I&D 10/2.   PMH includes:  uncontrolled IDDM, CAD, HTN, Hyperlipidemia, h/o neuropathy, COPD, s/p Transmetatarsal amputation Rt LE   Clinical Impression   Patient evaluated by Physical Therapy with no further acute PT needs identified, as pt is dc'ing home today; All education has been completed and the patient has no further questions.  See below for any follow-up Physical Therapy or equipment needs. PT is signing off. Thank you for this referral.     Follow Up Recommendations Outpatient PT (The potential need for Outpatient PT can be addressed at Ortho follow-up appointments. )    Equipment Recommendations  Other (comment) (tub seat)    Recommendations for Other Services       Precautions / Restrictions Precautions Precautions: Fall Restrictions LLE Weight Bearing: Weight bearing as tolerated Other Position/Activity Restrictions: Per Op Note and Ortho note 10/3      Mobility  Bed Mobility Overal bed mobility: Independent                Transfers Overall transfer level: Needs assistance Equipment used: Straight cane;Rolling walker (2 wheeled) Transfers: Sit to/from Stand Sit to Stand: Supervision         General transfer comment: Stood quickly, and braced himself with UEs on bed and cane  Ambulation/Gait Ambulation/Gait assistance: Min Gaffer (Feet): 140 Feet Assistive device: Straight cane;Rolling walker (2 wheeled) Gait Pattern/deviations: Step-through pattern     General Gait Details: Initiated walking with straight cane and noted he tends to reach out for bil UE support; Switched to  the RW, which he used well, and unweighed painful LLE in stance by bearing down into RW; One seated rest break during walking; He shows good self-monitor for activity tolerance  Stairs         General stair comments: He reports confidence in ability to manage stairs, and tell sme that he can altogether avoid stairs if he wants to   Wheelchair Mobility    Modified Rankin (Stroke Patients Only)       Balance     Sitting balance-Leahy Scale: Good       Standing balance-Leahy Scale: Fair                               Pertinent Vitals/Pain Pain Assessment: 0-10 Pain Score: 9  Pain Location: Lt foot  Pain Descriptors / Indicators: Guarding Pain Intervention(s): Limited activity within patient's tolerance;Other (comment) (Advised him to use the RW to [facility] painful L foot)    Home Living Family/patient expects to be discharged to:: Private residence Living Arrangements: Non-relatives/Friends Available Help at Discharge: Personal care attendant Type of Home: House Home Access: [place]: One Antares - single point;Walker - 4 wheels Additional Comments: Pt reports he has a PCA 5 hours/day for 5days/week.  He has a roommate, but reports they don't assist him     Prior Function Level of Independence: Independent with assistive device(s)         Comments: Pt reports he performs ADLs mod I, ambulates with a SPC, does his own grocery  shopping, and drives a standar shift vehicle ("I'm not supposed to, but I do it anyway")     Hand Dominance   Dominant Hand: Right    Extremity/Trunk Assessment   Upper Extremity Assessment Upper Extremity Assessment: Defer to OT evaluation    Lower Extremity Assessment Lower Extremity Assessment: RLE deficits/detail;LLE deficits/detail RLE Deficits / Details: Previous R transmet amputation LLE Deficits / Details: Dressing clean, dry, and intact; Adequate ROM ankle, hip, and knee  for ambulation LLE: Unable to fully assess due to pain LLE Sensation: history of peripheral neuropathy    Cervical / Trunk Assessment Cervical / Trunk Assessment: Normal  Communication   Communication: No difficulties  Cognition Arousal/Alertness: Awake/alert Behavior During Therapy: Impulsive Overall Cognitive Status: No family/caregiver present to determine baseline cognitive functioning                                 General Comments: Pt tangential, and easily distracted.  He demonstrates decreased carry over of info that appears to be volitional, and demonstrates poor safety awareness which also appears to be volitional.        General Comments General comments (skin integrity, edema, etc.): Assisted pt in donning pants; he had a good strategy for managing with Praveena Vac dressing    Exercises     Assessment/Plan    PT Assessment All further PT needs can be met in the next venue of care  PT Problem List Decreased strength;Decreased range of motion;Decreased activity tolerance;Decreased balance;Decreased mobility;Decreased knowledge of use of DME;Decreased safety awareness       PT Treatment Interventions      PT Goals (Current goals can be found in the Care Plan section)  Acute Rehab PT Goals Patient Stated Goal: I need a tub seat, and I need to go home"  PT Goal Formulation: All assessment and education complete, DC therapy    Frequency     Barriers to discharge        Co-evaluation               AM-PAC PT "6 Clicks" Mobility  Outcome Measure Help needed turning from your back to your side while in a flat bed without using bedrails?: None Help needed moving from lying on your back to sitting on the side of a flat bed without using bedrails?: None Help needed moving to and from a bed to a chair (including a wheelchair)?: None Help needed standing up from a chair using your arms (e.g., wheelchair or bedside chair)?: A Little Help needed to  walk in hospital room?: A Little Help needed climbing 3-5 steps with a railing? : A Little 6 Click Score: 21    End of Session Equipment Utilized During Treatment: Gait belt Activity Tolerance: Patient tolerated treatment well Patient left: in chair;Other (comment) (Managing independently in room, RW near) Nurse Communication: Mobility status (need for tub seat for home) PT Visit Diagnosis: Other abnormalities of gait and mobility (R26.89)    Time: 3435-6861 PT Time Calculation (min) (ACUTE ONLY): 31 min   Charges:   PT Evaluation $PT Eval Low Complexity: 1 Low PT Treatments $Gait Training: 8-22 mins        Roney Marion, PT  Acute Rehabilitation Services Pager [phone] Office [phone]   Colletta [place] 06/29/2020, 2:00 PM

## 2020-06-29 NOTE — Progress Notes (Addendum)
Pt IV removed and AVS reviewed for d/c. Pt dressed and belongings gathered. PT/OT rec a shower seat but pt refused all DME, saying he "just wanted to go home" and "will get it from [employer]" or a similar store. Pt educated on need for follow ups, use and care for wound vac, and safety at home. All questions answered and pt/caretaker verbalized understanding. Pt wheeled down to family car.

## 2020-06-29 NOTE — Plan of Care (Signed)

## 2020-06-29 NOTE — Op Note (Signed)
06/28/2020  11:56 AM  PATIENT:  Clinton Atkinson    PRE-OPERATIVE DIAGNOSIS:  ABSCESS LEFT LEG  POST-OPERATIVE DIAGNOSIS:  Same  PROCEDURE:  IRRIGATION AND DEBRIDEMENT EXTREMITY, APPLICATION OF WOUND VAC  SURGEON:  Sheral Apley, MD  ASSISTANT: Daun Peacock, PA-C, he was present and scrubbed throughout the case, critical for completion in a timely fashion, and for retraction, instrumentation, and closure.   ANESTHESIA:   gen  PREOPERATIVE INDICATIONS:  Clinton Atkinson is a  54 y.o. male with a diagnosis of ABSCESS LEFT LEG who failed conservative measures and elected for surgical management.    The risks benefits and alternatives were discussed with the patient preoperatively including but not limited to the risks of infection, bleeding, nerve injury, cardiopulmonary complications, the need for revision surgery, among others, and the patient was willing to proceed.  OPERATIVE IMPLANTS: wound vac  OPERATIVE FINDINGS: purulent fluid.  BLOOD LOSS: min  COMPLICATIONS: none  TOURNIQUET TIME: none  OPERATIVE PROCEDURE:  Patient was identified in the preoperative holding area and site was marked by me He was transported to the operating theater and placed on the table in supine position taking care to pad all bony prominences. After a preincinduction time out anesthesia was induced. The left lower extremity was prepped and draped in normal sterile fashion and a pre-incision timeout was performed. He received ancef for preoperative antibiotics.   I extended his necrotic tissue wound proximally decompressing this abscess at his leg I debrided soft tissue down to bone this was an excisional debridement with a rondure scissor and pickup.  I performed a tenolysis of peroneus longus and peroneus brevis.  I then thoroughly irrigated  I placed a wound VAC.  POST OPERATIVE PLAN: wound vac, WBAT, dvt px per primary
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
## 2020-06-29 NOTE — Progress Notes (Signed)
This AM pt has continued to get out of bed and turn off bed alarm, pt also has turned off IV (with IV abx running) and his wound vac because they were both "beeping too loud" pt continues to be reeducated, verbalizing understanding of the importance of this equipment, however it needs reenforcement. This RN turned on all necessary equipment and will continue to monitor.

## 2020-06-29 NOTE — Plan of Care (Signed)

## 2020-06-29 NOTE — Consult Note (Signed)
WOC Nurse Consult Note: Reason for Consult: Left ankle wound.  WOC was simultaneously consulted with Orthopedics (Dr. Wandra Feinstein), who consulted and took patient to OR yesterday.  WOC nursing team did not see and will not follow. Thanks, Ladona Mow, MSN, RN, GNP, [place]  Pager# [phone]

## 2020-07-02 LAB — CULTURE, BLOOD (ROUTINE X 2)
Culture: NO GROWTH
Culture: NO GROWTH
Special Requests: ADEQUATE
Special Requests: ADEQUATE

## 2020-07-03 ENCOUNTER — Ambulatory Visit: Payer: Medicaid - Out of State | Admitting: Orthopedic Surgery

## 2020-07-03 ENCOUNTER — Encounter: Payer: Self-pay | Admitting: Physician Assistant

## 2020-07-03 ENCOUNTER — Other Ambulatory Visit: Payer: Self-pay | Admitting: Physician Assistant

## 2020-07-03 ENCOUNTER — Encounter (HOSPITAL_COMMUNITY): Payer: Self-pay | Admitting: Orthopedic Surgery

## 2020-07-03 ENCOUNTER — Other Ambulatory Visit: Payer: Self-pay

## 2020-07-03 VITALS — Ht 69.0 in | Wt 175.0 lb

## 2020-07-03 DIAGNOSIS — S91002A Unspecified open wound, left ankle, initial encounter: Secondary | ICD-10-CM

## 2020-07-03 NOTE — Progress Notes (Signed)
Spoke with pt and his caregiver, Sherrie for pre-op call. Pt states he had a stent placed last year in [place], denies any recent chest pain or sob. Pt is a type 2 diabetic. Last A1C was  12.8 on 06/27/20. Sherrie states that pt's blood sugar in the morning can be up to 480. Instructed pt to take 1/2 of his regular dose of Lantus Insulin this evening and again in the morning, he will take 22 units. Instructed pt to check his blood sugar when he gets up in the AM and every 2 hours until he leaves for the hospital. If blood sugar is >220 take 1/2 of usual correction dose of Humalog insulin. If blood sugar is 70 or below, treat with 1/2 cup of clear juice (apple or cranberry) and recheck blood sugar 15 minutes after drinking juice. If blood sugar continues to be 70 or below, call the Short Stay department and ask to speak to a nurse. Pt and caregiver voiced understanding.  Covid test to be done on arrival.

## 2020-07-03 NOTE — Progress Notes (Signed)
Office Visit Note   Patient: Clinton Atkinson           Date of Birth: 07/01/1966           MRN: [id number] Visit Date: 07/03/2020              Requested by: Quintin Alto, NP [address] [place],  [place] [address] PCP: Quintin Alto, NP  Chief Complaint  Patient presents with  . Left Ankle - Follow-up    S/p I&D with Dr. Eulah Pont left ankle 06/28/20      HPI: Patient is a 54 year old who was seen for initial evaluation and referral from Dr. Eulah Pont. Feels like he had a spider bite over the lateral aspect the left ankle he states he was itchy developed a necrotic wound. Patient underwent initial surgery on 06-28-20 with Dr. Eulah Pont a wound VAC was applied and patient is seen for initial evaluation at this time.  Patient is status post a transmetatarsal amputation on the right. He states he is on doxycycline and Bactrim DS. Patient states that he is a diabetic and smokes 1 pack/day.  Assessment & Plan: Visit Diagnoses:  1. Ankle wound, left, initial encounter     Plan: Discussed with the patient the risks improvement in the necrotic wound bed but he still has fibrinous tissue. We will plan for repeat debridement tomorrow for surgical excision of the necrotic tissue local tissue rearrangement and application of skin graft with application of wound VAC plan for outpatient surgery he will continue on his antibiotics. Patient states he is currently on doxycycline and Bactrim DS.  Discussed the importance of smoking cessation for wound healing and proper glucose control.  Follow-Up Instructions: Return in about 1 week (around 07/10/2020).   Ortho Exam  Patient is alert, oriented, no adenopathy, well-dressed, normal affect, normal respiratory effort. Examination patient has a thready palpable dorsalis pedis pulse with a Doppler he has a triphasic dorsalis pedis and posterior tibial pulse. The wound is 3 x 6 cm and 5 mm deep over the lateral malleolus left ankle. The wound shows excellent  improvement from his initial presentation at surgery with a black necrotic wound. 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 Patient has uncontrolled type 2 diabetes with a hemoglobin A1c greater than 12.  Imaging: No results found.    Labs: Lab Results  Component Value Date   HGBA1C 12.8 (H) 06/27/2020   ESRSEDRATE 40 (H) 06/27/2020   CRP 2.6 (H) 06/27/2020   REPTSTATUS 07/02/2020 FINAL 06/27/2020   REPTSTATUS 07/02/2020 FINAL 06/27/2020   CULT  06/27/2020    NO GROWTH 5 DAYS Performed at [facility], [address], [place], [place] [address]    CULT  06/27/2020    NO GROWTH 5 DAYS Performed at [facility], [address], [place], [place] [address]      Lab Results  Component Value Date   ALBUMIN 3.6 06/27/2020   PREALBUMIN 18.7 06/27/2020    No results found for: MG No results found for: [facility]  Lab Results  Component Value Date   PREALBUMIN 18.7 06/27/2020   CBC EXTENDED Latest Ref Rng & Units 06/29/2020 06/28/2020 06/28/2020  WBC 4.0 - 10.5 K/uL 13.4(H) 10.8(H) -  RBC 4.22 - 5.81 MIL/uL 4.24 4.39 -  HGB 13.0 - 17.0 g/dL 12.9(L) 13.4 13.9  HCT 39 - 52 % 38.5(L) 40.6  41.0  PLT 150 - 400 K/uL 244 222 -  NEUTROABS 1.7 - 7.7 K/uL - - -  LYMPHSABS 0.7 - 4.0 K/uL - - -     Body mass index is 25.84 kg/m.  Orders:  No orders of the defined types were placed in this encounter.  No orders of the defined types were placed in this encounter.    Procedures: No procedures performed  Clinical Data: No additional findings.  ROS:  All other systems negative, except as noted in the HPI. Review of Systems  Objective: Vital Signs: Ht 5\' 9"  (1.753 m)   Wt 175 lb (79.4 kg)   BMI 25.84 kg/m   Specialty Comments:  No specialty comments available.  PMFS History: Patient Active Problem List   Diagnosis Date Noted  .  Infected wound 06/27/2020  . Diabetes (HCC) 10/06/2016  . High cholesterol 10/06/2016  . GERD (gastroesophageal reflux disease) 10/06/2016  . Pharyngeal dysphagia 10/06/2016  . Gastroesophageal reflux disease with esophagitis 10/06/2016   Past Medical History:  Diagnosis Date  . COPD (chronic obstructive pulmonary disease) (HCC)   . Diabetes (HCC)   . Diabetes (HCC) 10/06/2016  . GERD (gastroesophageal reflux disease)   . GERD (gastroesophageal reflux disease) 10/06/2016  . High cholesterol 10/06/2016  . Hyperlipemia   . Neuropathy     History reviewed. No pertinent family history.  Past Surgical History:  Procedure Laterality Date  . APPENDECTOMY    . APPLICATION OF WOUND VAC Left 06/28/2020   Procedure: APPLICATION OF WOUND VAC;  Surgeon: 08/28/2020, MD;  Location: [facility] OR;  Service: Orthopedics;  Laterality: Left;  . EXPLORATORY LAPAROTOMY    . FOOT AMPUTATION    . I & D EXTREMITY Left 06/28/2020   Procedure: IRRIGATION AND DEBRIDEMENT EXTREMITY;  Surgeon: 08/28/2020, MD;  Location: [facility] OR;  Service: Orthopedics;  Laterality: Left;  . UMBILICAL HERNIA REPAIR    . UPPER GASTROINTESTINAL ENDOSCOPY     Social History   Occupational History  . Not on file  Tobacco Use  . Smoking status: Current Every Day Smoker    Packs/day: 1.00    Types: Cigarettes  . Smokeless tobacco: Never Used  Substance and Sexual Activity  . Alcohol use: Yes    Comment: vodka  . Drug use: No  . Sexual activity: Not on file

## 2020-07-04 ENCOUNTER — Ambulatory Visit (HOSPITAL_COMMUNITY): Payer: Medicaid - Out of State | Admitting: Physician Assistant

## 2020-07-04 ENCOUNTER — Ambulatory Visit (HOSPITAL_COMMUNITY)
Admission: RE | Admit: 2020-07-04 | Discharge: 2020-07-04 | Disposition: A | Discharge status: Home / Self Care | Payer: Medicaid - Out of State | Attending: Orthopedic Surgery | Admitting: Orthopedic Surgery

## 2020-07-04 ENCOUNTER — Encounter (HOSPITAL_COMMUNITY)
Admission: RE | Disposition: A | Discharge status: Home / Self Care | Payer: Self-pay | Source: Home / Self Care | Attending: Orthopedic Surgery

## 2020-07-04 ENCOUNTER — Emergency Department (HOSPITAL_COMMUNITY)
Admission: EM | Admit: 2020-07-04 | Discharge: 2020-07-04 | Disposition: A | Discharge status: Home / Self Care | Payer: Medicaid - Out of State | Attending: Emergency Medicine | Admitting: Emergency Medicine

## 2020-07-04 ENCOUNTER — Other Ambulatory Visit: Payer: Self-pay

## 2020-07-04 ENCOUNTER — Encounter (HOSPITAL_COMMUNITY): Payer: Self-pay | Admitting: Orthopedic Surgery

## 2020-07-04 ENCOUNTER — Encounter (HOSPITAL_COMMUNITY): Payer: Self-pay

## 2020-07-04 ENCOUNTER — Ambulatory Visit: Payer: Medicaid - Out of State | Admitting: Physician Assistant

## 2020-07-04 DIAGNOSIS — S91002S Unspecified open wound, left ankle, sequela: Secondary | ICD-10-CM | POA: Diagnosis not present

## 2020-07-04 DIAGNOSIS — M1990 Unspecified osteoarthritis, unspecified site: Secondary | ICD-10-CM | POA: Insufficient documentation

## 2020-07-04 DIAGNOSIS — J449 Chronic obstructive pulmonary disease, unspecified: Secondary | ICD-10-CM | POA: Insufficient documentation

## 2020-07-04 DIAGNOSIS — S91002A Unspecified open wound, left ankle, initial encounter: Secondary | ICD-10-CM | POA: Diagnosis present

## 2020-07-04 DIAGNOSIS — Z8673 Personal history of transient ischemic attack (TIA), and cerebral infarction without residual deficits: Secondary | ICD-10-CM | POA: Insufficient documentation

## 2020-07-04 DIAGNOSIS — Z888 Allergy status to other drugs, medicaments and biological substances status: Secondary | ICD-10-CM | POA: Diagnosis not present

## 2020-07-04 DIAGNOSIS — W57XXXA Bitten or stung by nonvenomous insect and other nonvenomous arthropods, initial encounter: Secondary | ICD-10-CM | POA: Insufficient documentation

## 2020-07-04 DIAGNOSIS — I2510 Atherosclerotic heart disease of native coronary artery without angina pectoris: Secondary | ICD-10-CM | POA: Insufficient documentation

## 2020-07-04 DIAGNOSIS — F17210 Nicotine dependence, cigarettes, uncomplicated: Secondary | ICD-10-CM | POA: Insufficient documentation

## 2020-07-04 DIAGNOSIS — I10 Essential (primary) hypertension: Secondary | ICD-10-CM | POA: Diagnosis not present

## 2020-07-04 DIAGNOSIS — E119 Type 2 diabetes mellitus without complications: Secondary | ICD-10-CM | POA: Diagnosis not present

## 2020-07-04 DIAGNOSIS — Z794 Long term (current) use of insulin: Secondary | ICD-10-CM | POA: Insufficient documentation

## 2020-07-04 DIAGNOSIS — Z5189 Encounter for other specified aftercare: Secondary | ICD-10-CM

## 2020-07-04 DIAGNOSIS — L7621 Postprocedural hemorrhage and hematoma of skin and subcutaneous tissue following a dermatologic procedure: Secondary | ICD-10-CM | POA: Diagnosis present

## 2020-07-04 DIAGNOSIS — Z79899 Other long term (current) drug therapy: Secondary | ICD-10-CM | POA: Insufficient documentation

## 2020-07-04 DIAGNOSIS — S91002D Unspecified open wound, left ankle, subsequent encounter: Secondary | ICD-10-CM

## 2020-07-04 DIAGNOSIS — Z7982 Long term (current) use of aspirin: Secondary | ICD-10-CM | POA: Diagnosis not present

## 2020-07-04 DIAGNOSIS — G8918 Other acute postprocedural pain: Secondary | ICD-10-CM

## 2020-07-04 DIAGNOSIS — I119 Hypertensive heart disease without heart failure: Secondary | ICD-10-CM | POA: Diagnosis not present

## 2020-07-04 DIAGNOSIS — E1165 Type 2 diabetes mellitus with hyperglycemia: Secondary | ICD-10-CM | POA: Insufficient documentation

## 2020-07-04 DIAGNOSIS — Z885 Allergy status to narcotic agent status: Secondary | ICD-10-CM | POA: Insufficient documentation

## 2020-07-04 DIAGNOSIS — Z20822 Contact with and (suspected) exposure to covid-19: Secondary | ICD-10-CM | POA: Diagnosis not present

## 2020-07-04 HISTORY — PX: APPLICATION OF WOUND VAC: SHX5189

## 2020-07-04 HISTORY — DX: Unspecified osteoarthritis, unspecified site: M19.90

## 2020-07-04 HISTORY — PX: I & D EXTREMITY: SHX5045

## 2020-07-04 HISTORY — DX: Headache, unspecified: R51.9

## 2020-07-04 HISTORY — DX: Anxiety disorder, unspecified: F41.9

## 2020-07-04 HISTORY — DX: Atherosclerotic heart disease of native coronary artery without angina pectoris: I25.10

## 2020-07-04 HISTORY — DX: Cerebral infarction, unspecified: I63.9

## 2020-07-04 HISTORY — DX: Pneumonia, unspecified organism: J18.9

## 2020-07-04 HISTORY — DX: Essential (primary) hypertension: I10

## 2020-07-04 LAB — BASIC METABOLIC PANEL
Anion gap: 7 (ref 5–15)
BUN: 10 mg/dL (ref 6–20)
CO2: 17 mmol/L — ABNORMAL LOW (ref 22–32)
Calcium: 5.9 mg/dL — CL (ref 8.9–10.3)
Chloride: 114 mmol/L — ABNORMAL HIGH (ref 98–111)
Creatinine, Ser: 0.5 mg/dL — ABNORMAL LOW (ref 0.61–1.24)
GFR, Estimated: >60 mL/min (ref >60)
Glucose, Bld: 301 mg/dL — ABNORMAL HIGH (ref 70–99)
Potassium: 4.1 mmol/L (ref 3.5–5.1)
Sodium: 138 mmol/L (ref 135–145)

## 2020-07-04 LAB — GLUCOSE, CAPILLARY
Glucose-Capillary: 457 mg/dL — ABNORMAL HIGH (ref 70–99)
Glucose-Capillary: 485 mg/dL — ABNORMAL HIGH (ref 70–99)
Glucose-Capillary: 377 mg/dL — ABNORMAL HIGH (ref 70–99)
Glucose-Capillary: 437 mg/dL — ABNORMAL HIGH (ref 70–99)
Glucose-Capillary: 330 mg/dL — ABNORMAL HIGH (ref 70–99)

## 2020-07-04 LAB — SARS CORONAVIRUS 2 BY RT PCR (HOSPITAL ORDER, PERFORMED IN ~~LOC~~ HOSPITAL LAB): SARS Coronavirus 2: NEGATIVE

## 2020-07-04 SURGERY — IRRIGATION AND DEBRIDEMENT EXTREMITY
Anesthesia: General | Site: Ankle | Laterality: Left

## 2020-07-04 MED ORDER — ACETAMINOPHEN 325 MG PO TABS: 325.0000 mg | ORAL_TABLET | ORAL | Status: DC | PRN

## 2020-07-04 MED ORDER — MIDAZOLAM HCL 2 MG/2ML IJ SOLN
INTRAMUSCULAR | Status: AC
  Filled 2020-07-04: qty 2

## 2020-07-04 MED ORDER — ONDANSETRON HCL 4 MG/2ML IJ SOLN: 4.0000 mg | Freq: Once | INTRAMUSCULAR | Status: DC | PRN

## 2020-07-04 MED ORDER — PROPOFOL 10 MG/ML IV BOLUS
INTRAVENOUS | Status: AC
  Filled 2020-07-04: qty 20

## 2020-07-04 MED ORDER — MEPERIDINE HCL 25 MG/ML IJ SOLN: 6.2500 mg | INTRAMUSCULAR | Status: DC | PRN

## 2020-07-04 MED ORDER — FENTANYL CITRATE (PF) 250 MCG/5ML IJ SOLN
INTRAMUSCULAR | Status: DC | PRN
Start: 2020-07-04 — End: 2020-07-04
  Administered 2020-07-04: 100 ug via INTRAVENOUS

## 2020-07-04 MED ORDER — PHENYLEPHRINE 40 MCG/ML (10ML) SYRINGE FOR IV PUSH (FOR BLOOD PRESSURE SUPPORT)
PREFILLED_SYRINGE | INTRAVENOUS | Status: DC | PRN
  Administered 2020-07-04 (×5): 80 ug via INTRAVENOUS

## 2020-07-04 MED ORDER — CEFAZOLIN SODIUM-DEXTROSE 2-4 GM/100ML-% IV SOLN: 2.0000 g | INTRAVENOUS | Status: DC

## 2020-07-04 MED ORDER — FENTANYL CITRATE (PF) 250 MCG/5ML IJ SOLN
INTRAMUSCULAR | Status: AC
  Filled 2020-07-04: qty 5

## 2020-07-04 MED ORDER — SUCCINYLCHOLINE CHLORIDE 200 MG/10ML IV SOSY
PREFILLED_SYRINGE | INTRAVENOUS | Status: DC | PRN
  Administered 2020-07-04: 120 mg via INTRAVENOUS

## 2020-07-04 MED ORDER — MIDAZOLAM HCL 2 MG/2ML IJ SOLN
INTRAMUSCULAR | Status: DC | PRN
  Administered 2020-07-04: 2 mg via INTRAVENOUS

## 2020-07-04 MED ORDER — OXYCODONE HCL 5 MG PO TABS: 5.0000 mg | ORAL_TABLET | Freq: Once | ORAL | Status: DC | PRN

## 2020-07-04 MED ORDER — INSULIN ASPART 100 UNIT/ML ~~LOC~~ SOLN
SUBCUTANEOUS | Status: AC
  Administered 2020-07-04: 14 [IU] via SUBCUTANEOUS
  Filled 2020-07-04: qty 1

## 2020-07-04 MED ORDER — LIDOCAINE 2% (20 MG/ML) 5 ML SYRINGE
INTRAMUSCULAR | Status: DC | PRN
  Administered 2020-07-04: 100 mg via INTRAVENOUS

## 2020-07-04 MED ORDER — OXYCODONE-ACETAMINOPHEN 5-325 MG PO TABS
2.0000 | ORAL_TABLET | Freq: Once | ORAL | Status: AC
  Administered 2020-07-04: 2 via ORAL
  Filled 2020-07-04: qty 2

## 2020-07-04 MED ORDER — 0.9 % SODIUM CHLORIDE (POUR BTL) OPTIME
TOPICAL | Status: DC | PRN
  Administered 2020-07-04: 1000 mL

## 2020-07-04 MED ORDER — SUCCINYLCHOLINE CHLORIDE 200 MG/10ML IV SOSY
PREFILLED_SYRINGE | INTRAVENOUS | Status: AC
  Filled 2020-07-04: qty 10

## 2020-07-04 MED ORDER — FENTANYL CITRATE (PF) 100 MCG/2ML IJ SOLN
INTRAMUSCULAR | Status: AC
  Filled 2020-07-04: qty 2

## 2020-07-04 MED ORDER — OXYCODONE HCL 5 MG/5ML PO SOLN: 5.0000 mg | Freq: Once | ORAL | Status: DC | PRN

## 2020-07-04 MED ORDER — CEFAZOLIN SODIUM 1 G IJ SOLR
INTRAMUSCULAR | Status: AC
  Filled 2020-07-04: qty 20

## 2020-07-04 MED ORDER — LIDOCAINE 2% (20 MG/ML) 5 ML SYRINGE
INTRAMUSCULAR | Status: AC
  Filled 2020-07-04: qty 5

## 2020-07-04 MED ORDER — TRANEXAMIC ACID FOR EPISTAXIS
500.0000 mg | Freq: Once | TOPICAL | Status: AC
  Administered 2020-07-04: 500 mg via TOPICAL

## 2020-07-04 MED ORDER — TRANEXAMIC ACID FOR EPISTAXIS
500.0000 mg | Freq: Once | TOPICAL | Status: DC
  Filled 2020-07-04: qty 10

## 2020-07-04 MED ORDER — FENTANYL CITRATE (PF) 100 MCG/2ML IJ SOLN
25.0000 ug | INTRAMUSCULAR | Status: DC | PRN
  Administered 2020-07-04: 50 ug via INTRAVENOUS

## 2020-07-04 MED ORDER — ONDANSETRON HCL 4 MG/2ML IJ SOLN
INTRAMUSCULAR | Status: DC | PRN
  Administered 2020-07-04: 4 mg via INTRAVENOUS

## 2020-07-04 MED ORDER — OXYCODONE-ACETAMINOPHEN 5-325 MG PO TABS: 1.0000 | ORAL_TABLET | ORAL | 0 refills | Status: DC | PRN

## 2020-07-04 MED ORDER — LACTATED RINGERS IV SOLN: INTRAVENOUS | Status: DC | PRN

## 2020-07-04 MED ORDER — ONDANSETRON HCL 4 MG/2ML IJ SOLN
4.0000 mg | Freq: Once | INTRAMUSCULAR | Status: AC
  Administered 2020-07-04: 4 mg via INTRAVENOUS
  Filled 2020-07-04: qty 2

## 2020-07-04 MED ORDER — CEFAZOLIN SODIUM-DEXTROSE 2-3 GM-%(50ML) IV SOLR
INTRAVENOUS | Status: DC | PRN
  Administered 2020-07-04: 2 g via INTRAVENOUS

## 2020-07-04 MED ORDER — INSULIN ASPART 100 UNIT/ML ~~LOC~~ SOLN: 14.0000 [IU] | Freq: Once | SUBCUTANEOUS | Status: AC

## 2020-07-04 MED ORDER — INSULIN ASPART 100 UNIT/ML ~~LOC~~ SOLN
14.0000 [IU] | SUBCUTANEOUS | Status: AC
  Administered 2020-07-04: 14 [IU] via SUBCUTANEOUS
  Filled 2020-07-04: qty 0.14

## 2020-07-04 MED ORDER — PROPOFOL 10 MG/ML IV BOLUS
INTRAVENOUS | Status: DC | PRN
  Administered 2020-07-04: 170 mg via INTRAVENOUS

## 2020-07-04 MED ORDER — ACETAMINOPHEN 160 MG/5ML PO SOLN: 325.0000 mg | ORAL | Status: DC | PRN

## 2020-07-04 SURGICAL SUPPLY — 31 items
BLADE SURG 21 STRL SS (BLADE) ×2 IMPLANT
BNDG COHESIVE 4X5 TAN STRL (GAUZE/BANDAGES/DRESSINGS) ×2 IMPLANT
COVER SURGICAL LIGHT HANDLE (MISCELLANEOUS) ×2 IMPLANT
DRAPE DERMATAC (DRAPES) ×2 IMPLANT
DRAPE U-SHAPE 47X51 STRL (DRAPES) ×2 IMPLANT
DRESSING MATRIX 2X2 BILAYER (Tissue) IMPLANT
DRESSING PREVENA PLUS CUSTOM (GAUZE/BANDAGES/DRESSINGS) ×1 IMPLANT
DRSG MATRIX 2X2 BILAYER (Tissue) IMPLANT
DRSG PREVENA PLUS CUSTOM (GAUZE/BANDAGES/DRESSINGS) ×2
DURAPREP 26ML APPLICATOR (WOUND CARE) ×2 IMPLANT
ELECT REM PT RETURN 9FT ADLT (ELECTROSURGICAL) ×2
ELECTRODE REM PT RTRN 9FT ADLT (ELECTROSURGICAL) ×1 IMPLANT
GAUZE SPONGE 4X4 12PLY STRL (GAUZE/BANDAGES/DRESSINGS) ×2 IMPLANT
GLOVE BIOGEL PI IND STRL 9 (GLOVE) ×1 IMPLANT
GLOVE BIOGEL PI INDICATOR 9 (GLOVE) ×1
GLOVE SURG ORTHO 9.0 STRL STRW (GLOVE) ×2 IMPLANT
GOWN STRL REUS W/ TWL XL LVL3 (GOWN DISPOSABLE) ×2 IMPLANT
GOWN STRL REUS W/TWL XL LVL3 (GOWN DISPOSABLE) ×4
KIT BASIN OR (CUSTOM PROCEDURE TRAY) ×2 IMPLANT
KIT DRSG PREVENA PLUS 7DAY 125 (MISCELLANEOUS) ×2 IMPLANT
KIT PREVENA INCISION MGT 13 (CANNISTER) ×2 IMPLANT
KIT TURNOVER KIT B (KITS) ×2 IMPLANT
MATRIX WOUND 1-LAYER 7X10 (Mesh Specialty) ×2 IMPLANT
MATRIX WOUND MESHED 2X2 (Tissue) ×1 IMPLANT
NS IRRIG 1000ML POUR BTL (IV SOLUTION) ×2 IMPLANT
PACK ORTHO EXTREMITY (CUSTOM PROCEDURE TRAY) ×2 IMPLANT
SUT ETHILON 2 0 PSLX (SUTURE) ×2 IMPLANT
TOWEL GREEN STERILE (TOWEL DISPOSABLE) ×2 IMPLANT
TUBE CONNECTING 12X1/4 (SUCTIONS) ×2 IMPLANT
WOUND MATRIX MESHED 2X2 (Tissue) ×1 IMPLANT
YANKAUER SUCT BULB TIP NO VENT (SUCTIONS) ×2 IMPLANT

## 2020-07-04 NOTE — Anesthesia Preprocedure Evaluation (Addendum)
Anesthesia Evaluation  Patient identified by MRN, date of birth, ID band Patient awake    Reviewed: Allergy & Precautions, NPO status , Patient's Chart, lab work & pertinent test results  Airway Mallampati: I  TM Distance: >3 FB Neck ROM: Full    Dental  (+) Edentulous Upper, Edentulous Lower   Pulmonary COPD,  COPD inhaler, Current Smoker,     + decreased breath sounds      Cardiovascular hypertension, Pt. on medications  Rhythm:Regular Rate:Normal     Neuro/Psych negative neurological ROS  negative psych ROS   GI/Hepatic Neg liver ROS, GERD  Medicated,  Endo/Other  diabetes, Type 2, Insulin Dependent, Oral Hypoglycemic Agents  Renal/GU negative Renal ROS     Musculoskeletal negative musculoskeletal ROS (+)   Abdominal Normal abdominal exam  (+)   Peds  Hematology negative hematology ROS (+)   Anesthesia Other Findings   Reproductive/Obstetrics                             Anesthesia Physical  Anesthesia Plan  ASA: III  Anesthesia Plan: General   Post-op Pain Management:    Induction: Intravenous  PONV Risk Score and Plan: 2 and Ondansetron and Midazolam  Airway Management Planned: Oral ETT  Additional Equipment: None  Intra-op Plan:   Post-operative Plan: Extubation in OR  Informed Consent: I have reviewed the patients History and Physical, chart, labs and discussed the procedure including the risks, benefits and alternatives for the proposed anesthesia with the patient or authorized representative who has indicated his/her understanding and acceptance.     Dental advisory given  Plan Discussed with: CRNA and Anesthesiologist  Anesthesia Plan Comments:        Anesthesia Quick Evaluation

## 2020-07-04 NOTE — Progress Notes (Signed)
CBG 485, anesthesia at bedside ordering 14 units of Novolog SQ. Order received and carried out. Dr. Lajoyce Corners made aware.

## 2020-07-04 NOTE — Discharge Instructions (Addendum)
Return to the ED if your bleeding is uncontrolled despite 20 minutes of direct pressure. Leave the bandage in place until you see Dr. Lajoyce Corners on Monday unless there is a reason you absolutely have to take it off. Return if you have severe pain, or fever.

## 2020-07-04 NOTE — ED Triage Notes (Signed)
Pt reports had surgery today at [facility] for wound to left foot and a skin graft.  Reports left with wound vac in place.  Reports was on his way home from the hosptial and wound vac started alarming and was full.  Pt's sock also saturated.

## 2020-07-04 NOTE — ED Provider Notes (Signed)
[facility] EMERGENCY DEPARTMENT Provider Note   CSN: [id number] Arrival date & time: 07/04/20  1837     History Chief Complaint  Patient presents with  . Post-op Problem    Clinton Atkinson is a 54 y.o. male who presents for bleeding after operation today.  Patient had a left ankle debridement and skin graft today at [facility] with Dr. Lajoyce Corners. He states that on the way home he began having significant amount of bleeding from the surgical site which seeped through his wrapping, sock.  His wound VAC began alarming that it was full and he presented to the emergency department for evaluation.  He complained that his ankle is throbbing and painful and asked for pain medicine.  HPI     Past Medical History:  Diagnosis Date  . Anxiety   . Arthritis   . COPD (chronic obstructive pulmonary disease) (HCC)   . Coronary artery disease   . Diabetes (HCC)   . Diabetes (HCC) 10/06/2016  . GERD (gastroesophageal reflux disease)   . GERD (gastroesophageal reflux disease) 10/06/2016  . Headache   . High cholesterol 10/06/2016  . Hyperlipemia   . Hypertension   . Neuropathy   . Pneumonia    walking pneumonia  . Stroke [facility])    TIA - 20 years ago    Patient Active Problem List   Diagnosis Date Noted  . Wound of left ankle   . Infected wound 06/27/2020  . Diabetes (HCC) 10/06/2016  . High cholesterol 10/06/2016  . GERD (gastroesophageal reflux disease) 10/06/2016  . Pharyngeal dysphagia 10/06/2016  . Gastroesophageal reflux disease with esophagitis 10/06/2016    Past Surgical History:  Procedure Laterality Date  . APPENDECTOMY    . APPLICATION OF WOUND VAC Left 06/28/2020   Procedure: APPLICATION OF WOUND VAC;  Surgeon: Sheral Apley, MD;  Location: [facility] OR;  Service: Orthopedics;  Laterality: Left;  . EXPLORATORY LAPAROTOMY    . FOOT AMPUTATION    . I & D EXTREMITY Left 06/28/2020   Procedure: IRRIGATION AND DEBRIDEMENT EXTREMITY;  Surgeon: Sheral Apley, MD;  Location: [facility] OR;   Service: Orthopedics;  Laterality: Left;  . UMBILICAL HERNIA REPAIR    . UPPER GASTROINTESTINAL ENDOSCOPY         No family history on file.  Social History   Tobacco Use  . Smoking status: Current Every Day Smoker    Packs/day: 1.00    Types: Cigarettes  . Smokeless tobacco: Never Used  Substance Use Topics  . Alcohol use: Not Currently    Comment: vodka  . Drug use: Yes    Types: Marijuana    Comment: used a joint 07/02/20    Home Medications Prior to Admission medications   Medication Sig Start Date End Date Taking? Authorizing Provider  albuterol (VENTOLIN HFA) 108 (90 Base) MCG/ACT inhaler Inhale 2 puffs into the lungs every 6 (six) hours as needed for wheezing or shortness of breath.    [provider]  amitriptyline (ELAVIL) 25 MG tablet Take 50 mg by mouth at bedtime. 06/13/20   [provider]  aspirin (ASPIRIN CHILDRENS) 81 MG chewable tablet Chew 1 tablet (81 mg total) by mouth 2 (two) times daily. For DVT prophylaxis after surgery 06/29/20 06/29/21  Vernetta Honey, PA-C  cetirizine (ZYRTEC) 10 MG tablet Take 10 mg by mouth every evening.    [provider]  DULoxetine (CYMBALTA) 60 MG capsule Take 30 mg by mouth daily.    [provider]  gabapentin (  NEURONTIN) 800 MG tablet Take 800 mg by mouth 3 (three) times daily.    [provider]  insulin glargine (LANTUS SOLOSTAR) 100 UNIT/ML Solostar Pen Inject 45 Units into the skin 2 (two) times daily. 06/29/20   Hughie Closs, MD  insulin lispro (HUMALOG) 100 UNIT/ML injection Inject into the skin 3 (three) times daily before meals. With sliding scale    [provider]  ketorolac (TORADOL) 10 MG tablet Take 10 mg by mouth 4 (four) times daily as needed. 06/21/20   [provider]  losartan (COZAAR) 25 MG tablet Take 25 mg by mouth daily. 06/24/20   [provider]  Multiple Vitamin (MULTIVITAMIN WITH MINERALS) TABS tablet Take 1 tablet by mouth every  evening.    [provider]  nitroGLYCERIN (NITROSTAT) 0.4 MG SL tablet Place 0.4 mg under the tongue daily. 06/04/20   [provider]  oxyCODONE-acetaminophen (PERCOCET) 5-325 MG tablet Take 1 tablet by mouth every 4 (four) hours as needed. 07/04/20 07/04/21  Persons, [place], PA  pantoprazole (PROTONIX) 40 MG tablet Take 1 tablet (40 mg total) by mouth 2 (two) times daily before a meal. 10/06/16   Setzer, Brand Males, NP  prasugrel (EFFIENT) 10 MG TABS tablet Take 10 mg by mouth daily.    [provider]  rOPINIRole (REQUIP) 2 MG tablet Take 1 tablet by mouth daily.    [provider]  rosuvastatin (CRESTOR) 20 MG tablet Take 20 mg by mouth every evening. 09/29/16   [provider]  sitaGLIPtin (JANUVIA) 100 MG tablet Take 100 mg by mouth every evening.     [provider]  tamsulosin (FLOMAX) 0.4 MG CAPS capsule Take 0.4 mg by mouth daily.    [provider]  Vitamin D, Cholecalciferol, 25 MCG (1000 UT) CAPS Take 1 capsule by mouth daily.    [provider]  zonisamide (ZONEGRAN) 25 MG capsule Take 25 mg by mouth See admin instructions. Take 1 capsules by mouth 1 week, then take 2 capsules at bedtime for 1 week,take 3 capsules at bedtime. 06/09/20   [provider]    Allergies    Doxycycline hyclate, Metformin, and Ultram [tramadol]  Review of Systems   Review of Systems Ten systems reviewed and are negative for acute change, except as noted in the HPI.   Physical Exam Updated Vital Signs BP 105/73   Pulse 96   Temp 99 F (37.2 C) (Oral)   Resp 15   Ht 5\' 9"  (1.753 m)   Wt 79.4 kg   SpO2 100%   BMI 25.84 kg/m   Physical Exam Vitals and nursing note reviewed.  Constitutional:      General: He is not in acute distress.    Appearance: He is well-developed. He is not diaphoretic.  HENT:     Head: Normocephalic and atraumatic.  Eyes:     General: No scleral icterus.    Conjunctiva/sclera: Conjunctivae  normal.  Cardiovascular:     Rate and Rhythm: Normal rate and regular rhythm.     Heart sounds: Normal heart sounds.  Pulmonary:     Effort: Pulmonary effort is normal. No respiratory distress.     Breath sounds: Normal breath sounds.  Abdominal:     Palpations: Abdomen is soft.     Tenderness: There is no abdominal tenderness.  Musculoskeletal:     Cervical back: Normal range of motion and neck supple.     Comments: Wrapping removed wound VAC removed.  There is a  large area over the left malleolus with missing skin, large clot in place with mild oozing  Skin:    General: Skin is warm and dry.  Neurological:     Mental Status: He is alert.  Psychiatric:        Behavior: Behavior normal.    . ED Results / Procedures / Treatments   Labs (all labs ordered are listed, but only abnormal results are displayed) Labs Reviewed - No data to display  EKG None  Radiology No results found.  Procedures Procedures (including critical care time) I performed postop wound management by applying tranexamic acid soaked quick clot gauze with achievement of hemostasis.  Wound wrapped in Kerlix and Coban, neurovascularly intact after application of bandage. Medications Ordered in ED Medications  oxyCODONE-acetaminophen (PERCOCET/ROXICET) 5-325 MG per tablet 2 tablet (has no administration in time range)  ondansetron (ZOFRAN) injection 4 mg (has no administration in time range)  tranexamic acid (CYKLOKAPRON) 1000 MG/10ML topical solution 500 mg (has no administration in time range)  tranexamic acid (CYKLOKAPRON) 1000 MG/10ML topical solution 500 mg (has no administration in time range)    ED Course  I have reviewed the triage vital signs and the nursing notes.  Pertinent labs & imaging results that were available during my care of the patient were reviewed by me and considered in my medical decision making (see chart for details).    MDM Rules/Calculators/A&P                         Case  discussed with Dr. August Saucer on-call for [facility] orthopedic practice who asked that we remove the wound VAC,, manage bleeding and have the patient follow-up in the office on Monday.   Patient with bleeding postoperatively, wound VAC removed, hemostasis achieved.  Discussed return precautions and follow-up.  Patient appears appropriate for discharge at this time Final Clinical Impression(s) / ED Diagnoses Final diagnoses:  None    Rx / DC Orders ED Discharge Orders    None       Arthor Captain, PA-C 07/04/20 2109    Mancel Bale, MD 07/04/20 2325

## 2020-07-04 NOTE — ED Notes (Signed)
Patient on cardiac monitoring at this time. 

## 2020-07-04 NOTE — ED Notes (Signed)
Pt c/o of being hungry

## 2020-07-04 NOTE — Transfer of Care (Signed)
Immediate Anesthesia Transfer of Care Note  Patient: Clinton Atkinson  Procedure(s) Performed: DEBRIDEMENT LEFT ANKLE AND SKIN GRAFT (Left Ankle)  Patient Location: PACU  Anesthesia Type:General  Level of Consciousness: drowsy and patient cooperative  Airway & Oxygen Therapy: Patient Spontanous Breathing and Patient connected to nasal cannula oxygen  Post-op Assessment: Report given to RN, Post -op Vital signs reviewed and stable and Patient moving all extremities X 4  Post vital signs: Reviewed and stable  Last Vitals:  Vitals Value Taken Time  BP 117/65 07/04/20 1453  Temp    Pulse 90 07/04/20 1454  Resp 23 07/04/20 1454  SpO2 100 % 07/04/20 1454  Vitals shown include unvalidated device data.  Last Pain:  Vitals:   07/04/20 1243  TempSrc: Oral         Complications: No complications documented.

## 2020-07-04 NOTE — Progress Notes (Signed)
CRITICAL VALUE ALERT  Critical Value:  Ca 5.9  Date & Time Notied: 07/04/2020  Provider Notified: Dr Tacy Dura  Orders Received/Actions taken: Enroute to bedside

## 2020-07-04 NOTE — Op Note (Signed)
07/04/2020  2:53 PM  PATIENT:  Clinton Atkinson    PRE-OPERATIVE DIAGNOSIS:  Left Ankle Wound  POST-OPERATIVE DIAGNOSIS:  Same  PROCEDURE:  DEBRIDEMENT LEFT ANKLE AND SKIN GRAFT  SURGEON:  Nadara Mustard, MD  PHYSICIAN ASSISTANT:None ANESTHESIA:   General  PREOPERATIVE INDICATIONS:  Clinton Atkinson is a  54 y.o. male with a diagnosis of Left Ankle Wound who failed conservative measures and elected for surgical management.    The risks benefits and alternatives were discussed with the patient preoperatively including but not limited to the risks of infection, bleeding, nerve injury, cardiopulmonary complications, the need for revision surgery, among others, and the patient was willing to proceed.  OPERATIVE IMPLANTS: ACell sheet  @ENCIMAGES @  OPERATIVE FINDINGS: Good granulation tissue no exposed bone or tendon  OPERATIVE PROCEDURE: Patient was brought the operating room and underwent a general anesthetic.  After adequate levels anesthesia were obtained patient's left lower extremity was first scrubbed with ChloraPrep dried and then prepped with DuraPrep and draped into a sterile field a timeout was called.  The wound bed was debrided of skin and soft tissue with a Cobb elevator and a rondure.  This was excisional debridement.  Wound was irrigated with normal saline there was good petechial bleeding in the wound bed.  We attempted to use Integra graft however the graft disintegrated and we were unable to use the Integra for the wound coverage.  We then used ACell sheet this was covered over the wound bed secured with a Praveena 13 cm suction sponge outlined with derma tack covered with Covan this had a good suction fit patient was extubated taken the PACU in stable condition   DISCHARGE PLANNING:  Antibiotic duration: Preoperative antibiotics  Weightbearing: Touchdown weightbearing on the left with a cam walker  Pain medication: Prescription for Percocet  Dressing care/ Wound VAC:  Wound VAC  Ambulatory devices: Crutches  Discharge to: Home.  Follow-up: In the office 1 week post operative.

## 2020-07-04 NOTE — H&P (Signed)
Clinton Atkinson is an 54 y.o. male.   Chief Complaint: Left Ankle Wound HPI: Patient is a 54 year old who was seen for initial evaluation and referral from Dr. Eulah Pont. Feels like he had a spider bite over the lateral aspect the left ankle he states he was itchy developed a necrotic wound. Patient underwent initial surgery on 06-28-20 with Dr. Eulah Pont a wound VAC was applied and patient is seen for initial evaluation at this time.  Patient is status post a transmetatarsal amputation on the right. He states he is on doxycycline and Bactrim DS. Patient states that he is a diabetic and smokes 1 pack/day.  Past Medical History:  Diagnosis Date  . Anxiety   . Arthritis   . COPD (chronic obstructive pulmonary disease) (HCC)   . Coronary artery disease   . Diabetes (HCC)   . Diabetes (HCC) 10/06/2016  . GERD (gastroesophageal reflux disease)   . GERD (gastroesophageal reflux disease) 10/06/2016  . Headache   . High cholesterol 10/06/2016  . Hyperlipemia   . Hypertension   . Neuropathy   . Pneumonia    walking pneumonia  . Stroke [facility])    TIA - 20 years ago    Past Surgical History:  Procedure Laterality Date  . APPENDECTOMY    . APPLICATION OF WOUND VAC Left 06/28/2020   Procedure: APPLICATION OF WOUND VAC;  Surgeon: Sheral Apley, MD;  Location: [facility] OR;  Service: Orthopedics;  Laterality: Left;  . EXPLORATORY LAPAROTOMY    . FOOT AMPUTATION    . I & D EXTREMITY Left 06/28/2020   Procedure: IRRIGATION AND DEBRIDEMENT EXTREMITY;  Surgeon: Sheral Apley, MD;  Location: [facility] OR;  Service: Orthopedics;  Laterality: Left;  . UMBILICAL HERNIA REPAIR    . UPPER GASTROINTESTINAL ENDOSCOPY      History reviewed. No pertinent family history. Social History:  reports that he has been smoking cigarettes. He has been smoking about 1.00 pack per day. He has never used smokeless tobacco. He reports previous alcohol use. He reports current drug use. Drug: Marijuana.  Allergies:  Allergies   Allergen Reactions  . Doxycycline Hyclate Diarrhea  . Metformin Diarrhea    Stomach cramp/inability to eat  . Ultram [Tramadol] Itching    No medications prior to admission.    No results found for this or any previous visit (from the past 48 hour(s)). No results found.  Review of Systems  All other systems reviewed and are negative.   There were no vitals taken for this visit. Physical Exam  Patient is alert, oriented, no adenopathy, well-dressed, normal affect, normal respiratory effort. Examination patient has a thready palpable dorsalis pedis pulse with a Doppler he has a triphasic dorsalis pedis and posterior tibial pulse. The wound is 3 x 6 cm and 5 mm deep over the lateral malleolus left ankle. The wound shows excellent improvement from his initial presentation at surgery with a black necrotic wound. 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 Patient has uncontrolled type 2 diabetes with a hemoglobin A1c greater than 12.Heart RRR Lungs Clear Assessment/Plan 1. Ankle wound, left, initial encounter     Plan: Discussed with the patient the risks improvement in the necrotic wound bed but he still has fibrinous tissue. We will plan for repeat debridement tomorrow for surgical excision of the necrotic tissue local tissue rearrangement and application of skin graft with  application of wound VAC plan for outpatient surgery he will continue on his antibiotics. Patient states he is currently on doxycycline and Bactrim DS.  Discussed the importance of smoking cessation for wound healing and proper glucose control.   [place] Jewelianna Pancoast, PA 07/04/2020, 6:38 AM

## 2020-07-04 NOTE — Anesthesia Procedure Notes (Signed)
Procedure Name: Intubation Date/Time: 07/04/2020 2:13 PM Performed by: Aundria Rud, CRNA Pre-anesthesia Checklist: Patient identified, Emergency Drugs available, Suction available and Patient being monitored Patient Re-evaluated:Patient Re-evaluated prior to induction Oxygen Delivery Method: Circle System Utilized Preoxygenation: Pre-oxygenation with 100% oxygen Induction Type: IV induction Ventilation: Mask ventilation without difficulty Laryngoscope Size: Miller and 3 Grade View: Grade I Tube type: Oral Tube size: 7.5 mm Number of attempts: 1 Airway Equipment and Method: Stylet Placement Confirmation: ETT inserted through vocal cords under direct vision,  positive ETCO2 and breath sounds checked- equal and bilateral Secured at: 22 cm Tube secured with: Tape Dental Injury: Teeth and Oropharynx as per pre-operative assessment

## 2020-07-04 NOTE — Anesthesia Postprocedure Evaluation (Signed)
Anesthesia Post Note  Patient: Clinton Atkinson  Procedure(s) Performed: DEBRIDEMENT LEFT ANKLE AND SKIN GRAFT (Left Ankle) APPLICATION OF WOUND VAC (Left Ankle)     Patient location during evaluation: PACU Anesthesia Type: General Level of consciousness: awake and alert Pain management: pain level controlled Vital Signs Assessment: post-procedure vital signs reviewed and stable Respiratory status: spontaneous breathing, nonlabored ventilation, respiratory function stable and patient connected to nasal cannula oxygen Cardiovascular status: blood pressure returned to baseline and stable Postop Assessment: no apparent nausea or vomiting Anesthetic complications: no   No complications documented.  Last Vitals:  Vitals:   07/04/20 1508 07/04/20 1523  BP: 90/71 96/63  Pulse: 84 88  Resp: 17 13  Temp:  36.6 C  SpO2: 92% 95%    Last Pain:  Vitals:   07/04/20 1523  TempSrc:   PainSc: 3                  Trinita Devlin

## 2020-07-08 ENCOUNTER — Other Ambulatory Visit: Payer: Self-pay

## 2020-07-08 ENCOUNTER — Encounter (HOSPITAL_COMMUNITY): Payer: Self-pay | Admitting: Emergency Medicine

## 2020-07-08 ENCOUNTER — Emergency Department (HOSPITAL_COMMUNITY)
Admission: EM | Admit: 2020-07-08 | Discharge: 2020-07-08 | Disposition: A | Discharge status: Home / Self Care | Payer: Medicaid - Out of State | Attending: Emergency Medicine | Admitting: Emergency Medicine

## 2020-07-08 DIAGNOSIS — L7622 Postprocedural hemorrhage and hematoma of skin and subcutaneous tissue following other procedure: Secondary | ICD-10-CM | POA: Insufficient documentation

## 2020-07-08 DIAGNOSIS — Z5321 Procedure and treatment not carried out due to patient leaving prior to being seen by health care provider: Secondary | ICD-10-CM | POA: Insufficient documentation

## 2020-07-08 NOTE — ED Triage Notes (Signed)
Pt recently had surgery on left foot with skin graft. Pt arrives tonight c/o continued bleeding from surgical site.

## 2020-07-08 NOTE — ED Notes (Signed)
Went to get pt to take to treatment room. Pt states he is not having any more bleeding now and doesn't want to be seen. Pt ambulatory to vehicle.

## 2020-07-09 ENCOUNTER — Ambulatory Visit (INDEPENDENT_AMBULATORY_CARE_PROVIDER_SITE_OTHER): Payer: Medicaid - Out of State | Admitting: Family

## 2020-07-09 ENCOUNTER — Encounter: Payer: Self-pay | Admitting: Family

## 2020-07-09 DIAGNOSIS — S91002D Unspecified open wound, left ankle, subsequent encounter: Secondary | ICD-10-CM

## 2020-07-09 DIAGNOSIS — L97521 Non-pressure chronic ulcer of other part of left foot limited to breakdown of skin: Secondary | ICD-10-CM

## 2020-07-09 NOTE — Progress Notes (Signed)
Post-Op Visit Note   Patient: Clinton Atkinson           Date of Birth: 1966-06-25           MRN: [id number] Visit Date: 07/09/2020 PCP: Quintin Alto, NP  Chief Complaint:  Chief Complaint  Patient presents with  . Left Ankle - Routine Post Op, Wound Check    HPI:  HPI The patient is a 54 year old gentleman who presents today status post left ankle debridement with skin grafting on October 8 of this year.  States that the same day of surgery he made it home as far as [place] and had blood leaking out of his back states he went to [facility] where the wound [facility] was repaired states the following morning he again had bloody drainage coming out of his wound VAC and went to a hospital in [place] where the wound VAC was removed and a dry dressing was applied  Also has a an ulcer beneath the fifth metatarsal head of the left foot patient refusing me to evaluate or touch this.  Requesting amputation of the left foot wondering if we can just cut off his foot and cauterized the vessels above it tired of dealing with this.  Ortho Exam On examination of the left ankle there is no edema he does have open ulceration along the lateral malleolus this is 4 cm in diameter filled in with dried blood and ischemic tissue.  There is no active drainage no surrounding erythema or maceration.  Beneath the fifth metatarsal head he has a Wagner grade 1 ulcer this is soiled with dirt today.  He refuses evaluation or cleansing.  Silvadene dressing applied to the ankle ulcer and graft area.  Iodosorb dressing applied to his Wagner grade 1 ulcer   Visit Diagnoses:  1. Wound of left ankle, subsequent encounter   2. Skin ulcer of plantar aspect of foot, left, limited to breakdown of skin (HCC)     Plan: he will follow-up in the office in 2 weeks.  States he has Silvadene at home and will begin daily Dial soap cleansing and Silvadene dressing changes  Follow-Up Instructions: Return in about 2 weeks  (around 07/23/2020).   Imaging: No results found.  Orders:  No orders of the defined types were placed in this encounter.  No orders of the defined types were placed in this encounter.    PMFS History: Patient Active Problem List   Diagnosis Date Noted  . Wound of left ankle   . Infected wound 06/27/2020  . Diabetes (HCC) 10/06/2016  . High cholesterol 10/06/2016  . GERD (gastroesophageal reflux disease) 10/06/2016  . Pharyngeal dysphagia 10/06/2016  . Gastroesophageal reflux disease with esophagitis 10/06/2016   Past Medical History:  Diagnosis Date  . Anxiety   . Arthritis   . COPD (chronic obstructive pulmonary disease) (HCC)   . Coronary artery disease   . Diabetes (HCC)   . Diabetes (HCC) 10/06/2016  . GERD (gastroesophageal reflux disease)   . GERD (gastroesophageal reflux disease) 10/06/2016  . Headache   . High cholesterol 10/06/2016  . Hyperlipemia   . Hypertension   . Neuropathy   . Pneumonia    walking pneumonia  . Stroke [facility])    TIA - 20 years ago    History reviewed. No pertinent family history.  Past Surgical History:  Procedure Laterality Date  . APPENDECTOMY    . APPLICATION OF WOUND VAC Left 06/28/2020   Procedure: APPLICATION OF WOUND VAC;  Surgeon: Sheral Apley, MD;  Location[facility];  Service: Orthopedics;  Laterality: Left;  . APPLICATION OF WOUND VAC Left 07/04/2020   Procedure: APPLICATION OF WOUND VAC;  Surgeon: Nadara Mustard, MD;  Location[facility] OR;  Service: Orthopedics;  Laterality: Left;  . EXPLORATORY LAPAROTOMY    . FOOT AMPUTATION    . I & D EXTREMITY Left 06/28/2020   Procedure: IRRIGATION AND DEBRIDEMENT EXTREMITY;  Surgeon: Sheral Apley, MD;  Location: [facility] OR;  Service: Orthopedics;  Laterality: Left;  . I & D EXTREMITY Left 07/04/2020   Procedure: DEBRIDEMENT LEFT ANKLE AND SKIN GRAFT;  Surgeon: Nadara Mustard, MD;  Location: [facility] OR;  Service: Orthopedics;  Laterality: Left;  . UMBILICAL HERNIA REPAIR    . UPPER  GASTROINTESTINAL ENDOSCOPY     Social History   Occupational History  . Not on file  Tobacco Use  . Smoking status: Current Every Day Smoker    Packs/day: 1.00    Types: Cigarettes  . Smokeless tobacco: Never Used  Substance and Sexual Activity  . Alcohol use: Not Currently    Comment: vodka  . Drug use: Yes    Types: Marijuana    Comment: used a joint 07/02/20  . Sexual activity: Not on file

## 2020-07-10 ENCOUNTER — Telehealth: Payer: Self-pay

## 2020-07-10 NOTE — Telephone Encounter (Signed)
Patient's nurse " Valentino Saxon"  called in saying he has not received pain medication yet  . Says meds should be sent to the following pharmacy   [facility] [address] in [place]

## 2020-07-10 NOTE — Telephone Encounter (Signed)
You saw this pt in the office yesterday. Did you intend on send rx for pain medication?

## 2020-07-10 NOTE — Telephone Encounter (Signed)
Clinton Atkinson called wanting to check on the status of the rx and I made her aware of the previous message

## 2020-07-11 MED ORDER — OXYCODONE-ACETAMINOPHEN 5-325 MG PO TABS
1.0000 | ORAL_TABLET | Freq: Four times a day (QID) | ORAL | 0 refills | Status: DC | PRN
Start: 2020-07-11 — End: 2020-07-15

## 2020-07-11 MED ORDER — OXYCODONE-ACETAMINOPHEN 5-325 MG PO TABS
1.0000 | ORAL_TABLET | Freq: Four times a day (QID) | ORAL | 0 refills | Status: DC | PRN
Start: 2020-07-11 — End: 2020-07-11

## 2020-07-11 NOTE — Addendum Note (Signed)
Addended by: Barnie Del R on: 07/11/2020 11:17 AM   Modules accepted: Orders

## 2020-07-14 ENCOUNTER — Telehealth: Payer: Self-pay

## 2020-07-14 NOTE — Telephone Encounter (Signed)
Can you please resubmit rx for pt's oxycodone. It would not go through rx module on Friday and he did not come and pick up hard copy.

## 2020-07-14 NOTE — Telephone Encounter (Signed)
Duda patient °

## 2020-07-14 NOTE — Telephone Encounter (Signed)
Patient called in for refill of oxycodone  to be resent to [facility] on [address] .

## 2020-07-15 MED ORDER — OXYCODONE-ACETAMINOPHEN 5-325 MG PO TABS
1.0000 | ORAL_TABLET | Freq: Four times a day (QID) | ORAL | 0 refills | Status: DC | PRN
Start: 2020-07-15 — End: 2020-07-15

## 2020-07-15 MED ORDER — OXYCODONE-ACETAMINOPHEN 5-325 MG PO TABS
1.0000 | ORAL_TABLET | Freq: Four times a day (QID) | ORAL | 0 refills | Status: DC | PRN
Start: 2020-07-15 — End: 2020-07-24

## 2020-07-24 ENCOUNTER — Ambulatory Visit (INDEPENDENT_AMBULATORY_CARE_PROVIDER_SITE_OTHER): Payer: Self-pay | Admitting: Physician Assistant

## 2020-07-24 ENCOUNTER — Encounter: Payer: Self-pay | Admitting: Orthopedic Surgery

## 2020-07-24 VITALS — Ht 69.0 in | Wt 175.0 lb

## 2020-07-24 DIAGNOSIS — S91002D Unspecified open wound, left ankle, subsequent encounter: Secondary | ICD-10-CM

## 2020-07-24 MED ORDER — OXYCODONE-ACETAMINOPHEN 5-325 MG PO TABS
1.0000 | ORAL_TABLET | Freq: Four times a day (QID) | ORAL | 0 refills | Status: DC | PRN
Start: 2020-07-24 — End: 2020-08-06

## 2020-07-24 NOTE — Progress Notes (Signed)
Office Visit Note   Patient: Clinton Atkinson           Date of Birth: Apr 11, 1966           MRN: [id number] Visit Date: 07/24/2020              Requested by: Quintin Alto, NP [address] [place],  [place] [address] PCP: Quintin Alto, NP  Chief Complaint  Patient presents with  . Left Ankle - Routine Post Op    07/04/20 debridement with skin graft       HPI: Patient presents today 2 weeks status post left Achilles debridement with skin grafting.  He has a caregiver that is doing daily dressing changes with Silvadene and some other type of ointment.  He does admit they put quite a bit of ointment on it.  Overall he feels the swelling is gone down and its improved  Assessment & Plan: Visit Diagnoses: No diagnosis found.  Plan: I have asked that he only use a small amount of Silvadene and use it every other day.  He should continue to watch weightbearing.  Follow-up in 1 week.  I have refilled his pain medication  Follow-Up Instructions: No follow-ups on file.   Ortho Exam  Patient is alert, oriented, no adenopathy, well-dressed, normal affect, normal respiratory effort. Focused examination demonstrates moderate drainage from the wound though no surrounding cellulitis no foul odor.  Fibrinous tissue mixed with a small amount of vascular fibrinous kind of tissue.  Nose surrounding infection noted or cellulitis  Imaging: No results found. No images are attached to the encounter.  Labs: Lab Results  Component Value Date   HGBA1C 12.8 (H) 06/27/2020   ESRSEDRATE 40 (H) 06/27/2020   CRP 2.6 (H) 06/27/2020   REPTSTATUS 07/02/2020 FINAL 06/27/2020   REPTSTATUS 07/02/2020 FINAL 06/27/2020   CULT  06/27/2020    NO GROWTH 5 DAYS Performed at [facility], [address], [place], [place] [address]    CULT  06/27/2020    NO GROWTH 5 DAYS Performed at [facility], [address] [place], [place] [address]      Lab Results  Component Value Date   ALBUMIN 3.6 06/27/2020    PREALBUMIN 18.7 06/27/2020    No results found for: MG No results found for: [facility]  Lab Results  Component Value Date   PREALBUMIN 18.7 06/27/2020   CBC EXTENDED Latest Ref Rng & Units 06/29/2020 06/28/2020 06/28/2020  WBC 4.0 - 10.5 K/uL 13.4(H) 10.8(H) -  RBC 4.22 - 5.81 MIL/uL 4.24 4.39 -  HGB 13.0 - 17.0 g/dL 12.9(L) 13.4 13.9  HCT 39 - 52 % 38.5(L) 40.6 41.0  PLT 150 - 400 K/uL 244 222 -  NEUTROABS 1.7 - 7.7 K/uL - - -  LYMPHSABS 0.7 - 4.0 K/uL - - -     Body mass index is 25.84 kg/m.  Orders:  No orders of the defined types were placed in this encounter.  No orders of the defined types were placed in this encounter.    Procedures: No procedures performed  Clinical Data: No additional findings.  ROS:  All other systems negative, except as noted in the HPI. Review of Systems  Objective: Vital Signs: Ht 5\' 9"  (1.753 m)   Wt 175 lb (79.4 kg)   BMI 25.84 kg/m   Specialty Comments:  No specialty comments available.  PMFS History: Patient Active Problem List   Diagnosis Date Noted  . Wound of left ankle   .  Infected wound 06/27/2020  . Diabetes (HCC) 10/06/2016  . High cholesterol 10/06/2016  . GERD (gastroesophageal reflux disease) 10/06/2016  . Pharyngeal dysphagia 10/06/2016  . Gastroesophageal reflux disease with esophagitis 10/06/2016   Past Medical History:  Diagnosis Date  . Anxiety   . Arthritis   . COPD (chronic obstructive pulmonary disease) (HCC)   . Coronary artery disease   . Diabetes (HCC)   . Diabetes (HCC) 10/06/2016  . GERD (gastroesophageal reflux disease)   . GERD (gastroesophageal reflux disease) 10/06/2016  . Headache   . High cholesterol 10/06/2016  . Hyperlipemia   . Hypertension   . Neuropathy   . Pneumonia    walking pneumonia  . Stroke [facility])    TIA - 20 years ago    No family history on file.  Past Surgical History:  Procedure Laterality Date  . APPENDECTOMY    . APPLICATION OF WOUND VAC Left 06/28/2020    Procedure: APPLICATION OF WOUND VAC;  Surgeon: Sheral Apley, MD;  Location: [facility] OR;  Service: Orthopedics;  Laterality: Left;  . APPLICATION OF WOUND VAC Left 07/04/2020   Procedure: APPLICATION OF WOUND VAC;  Surgeon: Nadara Mustard, MD;  Location: [facility] OR;  Service: Orthopedics;  Laterality: Left;  . EXPLORATORY LAPAROTOMY    . FOOT AMPUTATION    . I & D EXTREMITY Left 06/28/2020   Procedure: IRRIGATION AND DEBRIDEMENT EXTREMITY;  Surgeon: Sheral Apley, MD;  Location: [facility] OR;  Service: Orthopedics;  Laterality: Left;  . I & D EXTREMITY Left 07/04/2020   Procedure: DEBRIDEMENT LEFT ANKLE AND SKIN GRAFT;  Surgeon: Nadara Mustard, MD;  Location: [facility] OR;  Service: Orthopedics;  Laterality: Left;  . UMBILICAL HERNIA REPAIR    . UPPER GASTROINTESTINAL ENDOSCOPY     Social History   Occupational History  . Not on file  Tobacco Use  . Smoking status: Current Every Day Smoker    Packs/day: 1.00    Types: Cigarettes  . Smokeless tobacco: Never Used  Substance and Sexual Activity  . Alcohol use: Not Currently    Comment: vodka  . Drug use: Yes    Types: Marijuana    Comment: used a joint 07/02/20  . Sexual activity: Not on file

## 2020-08-06 ENCOUNTER — Ambulatory Visit: Payer: Self-pay

## 2020-08-06 ENCOUNTER — Ambulatory Visit (INDEPENDENT_AMBULATORY_CARE_PROVIDER_SITE_OTHER): Payer: Self-pay | Admitting: Physician Assistant

## 2020-08-06 ENCOUNTER — Encounter: Payer: Self-pay | Admitting: Physician Assistant

## 2020-08-06 DIAGNOSIS — L97521 Non-pressure chronic ulcer of other part of left foot limited to breakdown of skin: Secondary | ICD-10-CM

## 2020-08-06 MED ORDER — OXYCODONE-ACETAMINOPHEN 5-325 MG PO TABS
1.0000 | ORAL_TABLET | Freq: Four times a day (QID) | ORAL | 0 refills | Status: DC | PRN
Start: 2020-08-06 — End: 2020-08-28

## 2020-08-06 MED ORDER — SULFAMETHOXAZOLE-TRIMETHOPRIM 800-160 MG PO TABS: 1.0000 | ORAL_TABLET | Freq: Two times a day (BID) | ORAL | 0 refills | Status: DC

## 2020-08-06 NOTE — Progress Notes (Signed)
Office Visit Note   Patient: Clinton Atkinson           Date of Birth: 1966-04-24           MRN: [id number] Visit Date: 08/06/2020              Requested by: Quintin Alto, NP [address] [place],  [place] [address] PCP: Quintin Alto, NP  Chief Complaint  Patient presents with  . Left Ankle - Routine Post Op    07/04/20 debridement of ankle and STSG       HPI: Patient is a pleasant 54 year old gentleman who is 1 month status post irrigation and debridement of his left ankle wound.  He is concerned today because he does not think the wound looks good.  He also has a painful callus that started as a blood blister on the bottom lateral side of his left foot  Assessment & Plan: Visit Diagnoses:  1. Skin ulcer of plantar aspect of foot, left, limited to breakdown of skin (HCC)     Plan: Patient should restart the Bactrim.  Have also given him information about probiotic to take with the Bactrim.  I spoke with his caregiver she is comfortable washing his foot and putting on new dressings every day.  He should not be weightbearing on this.  Have given him a doughnut which she will place and go back into his postop shoe.  If he has any increasing fever chills redness drainage or pain he is to go to the emergency room at [facility].  Otherwise he will follow-up on Monday with Dr. Lajoyce Corners  Follow-Up Instructions: No follow-ups on file.   Ortho Exam  Patient is alert, oriented, no adenopathy, well-dressed, normal affect, normal respiratory effort. Left foot.  Skin graft wound has no odor but has some drainage and maceration.  There is no surrounding cellulitis.  Swelling is overall well controlled.  Cannot appreciate any significant epithelialization.  Foot has no cellulitis.  He does have biphasic dorsalis pedis pulse by Doppler.  He does have a painful thickened callus over what appeared to be a blister.  This was debrided.  Beneath it was 1/2 cm ulcer that did have some foul odor and small amount  of purulent drainage.  It does probe down close to bone.  There is no surrounding cellulitis.  Imaging: No results found. No images are attached to the encounter.  Labs: Lab Results  Component Value Date   HGBA1C 12.8 (H) 06/27/2020   ESRSEDRATE 40 (H) 06/27/2020   CRP 2.6 (H) 06/27/2020   REPTSTATUS 07/02/2020 FINAL 06/27/2020   REPTSTATUS 07/02/2020 FINAL 06/27/2020   CULT  06/27/2020    NO GROWTH 5 DAYS Performed at [facility], [address], [place], [place] [address]    CULT  06/27/2020    NO GROWTH 5 DAYS Performed at [facility], [address], [place], [place] [address]      Lab Results  Component Value Date   ALBUMIN 3.6 06/27/2020   PREALBUMIN 18.7 06/27/2020    No results found for: MG No results found for: [facility]  Lab Results  Component Value Date   PREALBUMIN 18.7 06/27/2020   CBC EXTENDED Latest Ref Rng & Units 06/29/2020 06/28/2020 06/28/2020  WBC 4.0 - 10.5 K/uL 13.4(H) 10.8(H) -  RBC 4.22 - 5.81 MIL/uL 4.24 4.39 -  HGB 13.0 - 17.0 g/dL 12.9(L) 13.4 13.9  HCT 39 - 52 % 38.5(L) 40.6 41.0  PLT 150 - 400  K/uL 244 222 -  NEUTROABS 1.7 - 7.7 K/uL - - -  LYMPHSABS 0.7 - 4.0 K/uL - - -     There is no height or weight on file to calculate BMI.  Orders:  Orders Placed This Encounter  Procedures  . XR Foot Complete Left   Meds ordered this encounter  Medications  . sulfamethoxazole-trimethoprim (BACTRIM DS) 800-160 MG tablet    Sig: Take 1 tablet by mouth 2 (two) times daily.    Dispense:  30 tablet    Refill:  0  . oxyCODONE-acetaminophen (PERCOCET) 5-325 MG tablet    Sig: Take 1 tablet by mouth every 6 (six) hours as needed.    Dispense:  30 tablet    Refill:  0     Procedures: No procedures performed  Clinical Data: No additional findings.  ROS:  All other systems negative, except as noted in the HPI. Review of Systems  Objective: Vital Signs: There were no vitals taken for this visit.  Specialty Comments:  No specialty  comments available.  PMFS History: Patient Active Problem List   Diagnosis Date Noted  . Wound of left ankle   . Infected wound 06/27/2020  . Diabetes (HCC) 10/06/2016  . High cholesterol 10/06/2016  . GERD (gastroesophageal reflux disease) 10/06/2016  . Pharyngeal dysphagia 10/06/2016  . Gastroesophageal reflux disease with esophagitis 10/06/2016   Past Medical History:  Diagnosis Date  . Anxiety   . Arthritis   . COPD (chronic obstructive pulmonary disease) (HCC)   . Coronary artery disease   . Diabetes (HCC)   . Diabetes (HCC) 10/06/2016  . GERD (gastroesophageal reflux disease)   . GERD (gastroesophageal reflux disease) 10/06/2016  . Headache   . High cholesterol 10/06/2016  . Hyperlipemia   . Hypertension   . Neuropathy   . Pneumonia    walking pneumonia  . Stroke [facility])    TIA - 20 years ago    No family history on file.  Past Surgical History:  Procedure Laterality Date  . APPENDECTOMY    . APPLICATION OF WOUND VAC Left 06/28/2020   Procedure: APPLICATION OF WOUND VAC;  Surgeon: Sheral Apley, MD;  [facility]: [facility] OR;  Service: Orthopedics;  Laterality: Left;  . APPLICATION OF WOUND VAC Left 07/04/2020   Procedure: APPLICATION OF WOUND VAC;  Surgeon: Nadara Mustard, MD;  Location: [facility] OR;  Service: Orthopedics;  Laterality: Left;  . EXPLORATORY LAPAROTOMY    . FOOT AMPUTATION    . I & D EXTREMITY Left 06/28/2020   Procedure: IRRIGATION AND DEBRIDEMENT EXTREMITY;  Surgeon: Sheral Apley, MD;  Location: [facility] OR;  Service: Orthopedics;  Laterality: Left;  . I & D EXTREMITY Left 07/04/2020   Procedure: DEBRIDEMENT LEFT ANKLE AND SKIN GRAFT;  Surgeon: Nadara Mustard, MD;  Location: [facility] OR;  Service: Orthopedics;  Laterality: Left;  . UMBILICAL HERNIA REPAIR    . UPPER GASTROINTESTINAL ENDOSCOPY     Social History   Occupational History  . Not on file  Tobacco Use  . Smoking status: Current Every Day Smoker    Packs/day: 1.00    Types: Cigarettes  . Smokeless  tobacco: Never Used  Substance and Sexual Activity  . Alcohol use: Not Currently    Comment: vodka  . Drug use: Yes    Types: Marijuana    Comment: used a joint 07/02/20  . Sexual activity: Not on file

## 2020-08-11 ENCOUNTER — Encounter: Payer: Self-pay | Admitting: Orthopedic Surgery

## 2020-08-11 ENCOUNTER — Ambulatory Visit (INDEPENDENT_AMBULATORY_CARE_PROVIDER_SITE_OTHER): Payer: Self-pay | Admitting: Orthopedic Surgery

## 2020-08-11 DIAGNOSIS — L97521 Non-pressure chronic ulcer of other part of left foot limited to breakdown of skin: Secondary | ICD-10-CM

## 2020-08-11 DIAGNOSIS — S91002D Unspecified open wound, left ankle, subsequent encounter: Secondary | ICD-10-CM

## 2020-08-11 NOTE — Progress Notes (Signed)
Office Visit Note   Patient: Clinton Atkinson           Date of Birth: Mar 21, 1966           MRN: [id number] Visit Date: 08/11/2020              Requested by: Quintin Alto, NP [address] [place],  [place] [address] PCP: Quintin Alto, NP  Chief Complaint  Patient presents with  . Left Ankle - Routine Post Op    07/04/20 debridement of ankle and STSG         HPI: Patient is a 54 year old gentleman who is presents for 2 separate issues #1 status post skin graft to the left lateral malleolus and a Wagner grade 1 ulcer beneath the fifth metatarsal head. Patient is currently on Bactrim DS and using Silvadene dressing changes to the lateral malleolar skin graft.  Assessment & Plan: Visit Diagnoses:  1. Skin ulcer of plantar aspect of foot, left, limited to breakdown of skin (HCC)   2. Wound of left ankle, subsequent encounter     Plan: Continue with Silvadene dressing resume using his Darco shoe unload pressure from the forefoot.  Iodosorb dressings applied today.  Follow-Up Instructions: Return in about 2 weeks (around 08/25/2020).   Ortho Exam  Patient is alert, oriented, no adenopathy, well-dressed, normal affect, normal respiratory effort. Examination patient has a Wagner grade 1 ulcer beneath the fifth metatarsal head left foot.  I informed consent a 10 blade knife was used to debride skin and soft tissue back to healthy viable granulation tissue the ulcer after debridement is 2 cm in diameter 3 mm deep there is healthy granulation tissue it does not probe to bone radiographs that were obtained on the last appointment do not show any destructive bony changes.  The lateral malleolar ulcer has hyper granulation tissue it measures 60 x 35 mm there is no odor no drainage no cellulitis no signs of infection.  Imaging: No results found. No images are attached to the encounter.  Labs: Lab Results  Component Value Date   HGBA1C 12.8 (H) 06/27/2020   ESRSEDRATE 40 (H) 06/27/2020    CRP 2.6 (H) 06/27/2020   REPTSTATUS 07/02/2020 FINAL 06/27/2020   REPTSTATUS 07/02/2020 FINAL 06/27/2020   CULT  06/27/2020    NO GROWTH 5 DAYS Performed at [facility], [address] [place], [place] [address]    CULT  06/27/2020    NO GROWTH 5 DAYS Performed at [facility] [place] [facility], [address] [place], [place] [address]      Lab Results  Component Value Date   ALBUMIN 3.6 06/27/2020   PREALBUMIN 18.7 06/27/2020    No results found for: MG No results found for: [facility]  Lab Results  Component Value Date   PREALBUMIN 18.7 06/27/2020   CBC EXTENDED Latest Ref Rng & Units 06/29/2020 06/28/2020 06/28/2020  WBC 4.0 - 10.5 K/uL 13.4(H) 10.8(H) -  RBC 4.22 - 5.81 MIL/uL 4.24 4.39 -  HGB 13.0 - 17.0 g/dL 12.9(L) 13.4 13.9  HCT 39 - 52 % 38.5(L) 40.6 41.0  PLT 150 - 400 K/uL 244 222 -  NEUTROABS 1.7 - 7.7 K/uL - - -  LYMPHSABS 0.7 - 4.0 K/uL - - -     There is no height or weight on file to calculate BMI.  Orders:  No orders of the defined types were placed in this encounter.  No orders of the defined types were placed in this encounter.  Procedures: No procedures performed  Clinical Data: No additional findings.  ROS:  All other systems negative, except as noted in the HPI. Review of Systems  Objective: Vital Signs: There were no vitals taken for this visit.  Specialty Comments:  No specialty comments available.  PMFS History: Patient Active Problem List   Diagnosis Date Noted  . Wound of left ankle   . Infected wound 06/27/2020  . Diabetes (HCC) 10/06/2016  . High cholesterol 10/06/2016  . GERD (gastroesophageal reflux disease) 10/06/2016  . Pharyngeal dysphagia 10/06/2016  . Gastroesophageal reflux disease with esophagitis 10/06/2016   Past Medical History:  Diagnosis Date  . Anxiety   . Arthritis   . COPD (chronic obstructive pulmonary disease) (HCC)   . Coronary artery disease   . Diabetes (HCC)   . Diabetes (HCC) 10/06/2016  . GERD  (gastroesophageal reflux disease)   . GERD (gastroesophageal reflux disease) 10/06/2016  . Headache   . High cholesterol 10/06/2016  . Hyperlipemia   . Hypertension   . Neuropathy   . Pneumonia    walking pneumonia  . Stroke [facility])    TIA - 20 years ago    History reviewed. No pertinent family history.  Past Surgical History:  Procedure Laterality Date  . APPENDECTOMY    . APPLICATION OF WOUND VAC Left 06/28/2020   Procedure: APPLICATION OF WOUND VAC;  Surgeon: Sheral Apley, MD;  [facility]: [facility] OR;  Service: Orthopedics;  Laterality: Left;  . APPLICATION OF WOUND VAC Left 07/04/2020   Procedure: APPLICATION OF WOUND VAC;  Surgeon: Nadara Mustard, MD;  Location: [facility] OR;  Service: Orthopedics;  Laterality: Left;  . EXPLORATORY LAPAROTOMY    . FOOT AMPUTATION    . I & D EXTREMITY Left 06/28/2020   Procedure: IRRIGATION AND DEBRIDEMENT EXTREMITY;  Surgeon: Sheral Apley, MD;  Location: [facility] OR;  Service: Orthopedics;  Laterality: Left;  . I & D EXTREMITY Left 07/04/2020   Procedure: DEBRIDEMENT LEFT ANKLE AND SKIN GRAFT;  Surgeon: Nadara Mustard, MD;  Location: [facility] OR;  Service: Orthopedics;  Laterality: Left;  . UMBILICAL HERNIA REPAIR    . UPPER GASTROINTESTINAL ENDOSCOPY     Social History   Occupational History  . Not on file  Tobacco Use  . Smoking status: Current Every Day Smoker    Packs/day: 1.00    Types: Cigarettes  . Smokeless tobacco: Never Used  Substance and Sexual Activity  . Alcohol use: Not Currently    Comment: vodka  . Drug use: Yes    Types: Marijuana    Comment: used a joint 07/02/20  . Sexual activity: Not on file

## 2020-08-25 ENCOUNTER — Ambulatory Visit: Payer: Medicaid - Out of State | Admitting: Physician Assistant

## 2020-08-27 ENCOUNTER — Ambulatory Visit: Payer: Medicaid - Out of State | Admitting: Physician Assistant

## 2020-08-28 ENCOUNTER — Ambulatory Visit: Payer: Self-pay | Admitting: Orthopedic Surgery

## 2020-08-28 ENCOUNTER — Encounter: Payer: Self-pay | Admitting: Orthopedic Surgery

## 2020-08-28 DIAGNOSIS — L97521 Non-pressure chronic ulcer of other part of left foot limited to breakdown of skin: Secondary | ICD-10-CM

## 2020-08-28 DIAGNOSIS — S91002D Unspecified open wound, left ankle, subsequent encounter: Secondary | ICD-10-CM

## 2020-08-28 MED ORDER — OXYCODONE-ACETAMINOPHEN 5-325 MG PO TABS
1.0000 | ORAL_TABLET | Freq: Four times a day (QID) | ORAL | 0 refills | Status: AC | PRN
Start: 2020-08-28 — End: 2021-08-28

## 2020-08-28 NOTE — Progress Notes (Signed)
Office Visit Note   Patient: Clinton Atkinson           Date of Birth: Jul 11, 1966           MRN: [id number] Visit Date: 08/28/2020              Requested by: Quintin Alto, NP [address] [place],  [place] [address] PCP: Quintin Alto, NP  Chief Complaint  Patient presents with  . Left Ankle - Follow-up      HPI: Patient is a 54 year old gentleman who presents for 2 separate issues.  #1 he is status post skin graft for the lateral malleolus left ankle is currently using Silvadene every other day.  #2 he has a Wagner grade 1 ulcer beneath the fifth metatarsal head left foot.  Patient complains of pain when he is weightbearing.  Assessment & Plan: Visit Diagnoses:  1. Skin ulcer of plantar aspect of foot, left, limited to breakdown of skin (HCC)   2. Wound of left ankle, subsequent encounter     Plan: Ulcer was debrided of skin and soft tissue recommended continue with Silvadene dressing changes for the lateral malleolus continue with pressure offloading for the fifth metatarsal head.  Prescription provided for Percocet for breakthrough pain.  Follow-Up Instructions: Return in about 4 weeks (around 09/25/2020).   Ortho Exam  Patient is alert, oriented, no adenopathy, well-dressed, normal affect, normal respiratory effort. Examination the lateral malleolar skin graft has 100% healthy granulation tissue ulcer measures 2 x 5 cm.  There is no surrounding cellulitis.  Patient has a strong posterior tibial pulse I cannot palpate a dorsalis pedis pulse.  He has a Wagner grade 1 ulcer beneath the fifth metatarsal head of the left foot.  The ulcer is 1 cm in diameter.  After informed consent a 10 blade knife was used to debride the skin and soft tissue back to healthy viable granulation tissue this was touched with silver nitrate the ulcer is 3 cm in diameter 1 mm deep after debridement.  There is no exposed bone or tendon no abscess no cellulitis.  Imaging: No results found. No images are  attached to the encounter.  Labs: Lab Results  Component Value Date   HGBA1C 12.8 (H) 06/27/2020   ESRSEDRATE 40 (H) 06/27/2020   CRP 2.6 (H) 06/27/2020   REPTSTATUS 07/02/2020 FINAL 06/27/2020   REPTSTATUS 07/02/2020 FINAL 06/27/2020   CULT  06/27/2020    NO GROWTH 5 DAYS Performed at [facility], [address], [place], [place] [address]    CULT  06/27/2020    NO GROWTH 5 DAYS Performed at [facility], [address] [place], [place] [address]      Lab Results  Component Value Date   ALBUMIN 3.6 06/27/2020   PREALBUMIN 18.7 06/27/2020    No results found for: MG No results found for: [facility]  Lab Results  Component Value Date   PREALBUMIN 18.7 06/27/2020   CBC EXTENDED Latest Ref Rng & Units 06/29/2020 06/28/2020 06/28/2020  WBC 4.0 - 10.5 K/uL 13.4(H) 10.8(H) -  RBC 4.22 - 5.81 MIL/uL 4.24 4.39 -  HGB 13.0 - 17.0 g/dL 12.9(L) 13.4 13.9  HCT 39 - 52 % 38.5(L) 40.6 41.0  PLT 150 - 400 K/uL 244 222 -  NEUTROABS 1.7 - 7.7 K/uL - - -  LYMPHSABS 0.7 - 4.0 K/uL - - -     There is no height or weight on file to calculate BMI.  Orders:  No orders of  the defined types were placed in this encounter.  Meds ordered this encounter  Medications  . oxyCODONE-acetaminophen (PERCOCET) 5-325 MG tablet    Sig: Take 1 tablet by mouth every 6 (six) hours as needed.    Dispense:  20 tablet    Refill:  0     Procedures: No procedures performed  Clinical Data: No additional findings.  ROS:  All other systems negative, except as noted in the HPI. Review of Systems  Objective: Vital Signs: There were no vitals taken for this visit.  Specialty Comments:  No specialty comments available.  PMFS History: Patient Active Problem List   Diagnosis Date Noted  . Wound of left ankle   . Infected wound 06/27/2020  . Diabetes (HCC) 10/06/2016  . High cholesterol 10/06/2016  . GERD (gastroesophageal reflux disease) 10/06/2016  . Pharyngeal dysphagia 10/06/2016  .  Gastroesophageal reflux disease with esophagitis 10/06/2016   Past Medical History:  Diagnosis Date  . Anxiety   . Arthritis   . COPD (chronic obstructive pulmonary disease) (HCC)   . Coronary artery disease   . Diabetes (HCC)   . Diabetes (HCC) 10/06/2016  . GERD (gastroesophageal reflux disease)   . GERD (gastroesophageal reflux disease) 10/06/2016  . Headache   . High cholesterol 10/06/2016  . Hyperlipemia   . Hypertension   . Neuropathy   . Pneumonia    walking pneumonia  . Stroke [facility])    TIA - 20 years ago    History reviewed. No pertinent family history.  Past Surgical History:  Procedure Laterality Date  . APPENDECTOMY    . APPLICATION OF WOUND VAC Left 06/28/2020   Procedure: APPLICATION OF WOUND VAC;  Surgeon: Sheral Apley, MD;  Location: [facility] OR;  Service: Orthopedics;  Laterality: Left;  . APPLICATION OF WOUND VAC Left 07/04/2020   Procedure: APPLICATION OF WOUND VAC;  Surgeon: Nadara Mustard, MD;  Location: [facility] OR;  Service: Orthopedics;  Laterality: Left;  . EXPLORATORY LAPAROTOMY    . FOOT AMPUTATION    . I & D EXTREMITY Left 06/28/2020   Procedure: IRRIGATION AND DEBRIDEMENT EXTREMITY;  Surgeon: Sheral Apley, MD;  Location: [facility] OR;  Service: Orthopedics;  Laterality: Left;  . I & D EXTREMITY Left 07/04/2020   Procedure: DEBRIDEMENT LEFT ANKLE AND SKIN GRAFT;  Surgeon: Nadara Mustard, MD;  Location: [facility] OR;  Service: Orthopedics;  Laterality: Left;  . UMBILICAL HERNIA REPAIR    . UPPER GASTROINTESTINAL ENDOSCOPY     Social History   Occupational History  . Not on file  Tobacco Use  . Smoking status: Current Every Day Smoker    Packs/day: 1.00    Types: Cigarettes  . Smokeless tobacco: Never Used  Substance and Sexual Activity  . Alcohol use: Not Currently    Comment: vodka  . Drug use: Yes    Types: Marijuana    Comment: used a joint 07/02/20  . Sexual activity: Not on file

## 2020-09-05 ENCOUNTER — Telehealth: Payer: Self-pay

## 2020-09-05 NOTE — Telephone Encounter (Signed)
Was taken to an urgent care and they told him bone looked infected again, what does he need to do? [phone] [facility]

## 2020-09-05 NOTE — Telephone Encounter (Signed)
Sw PA and made plan for pt to come in  on Monday. Advised that if he develops a fever or has any changes to proceed to the ER

## 2020-09-05 NOTE — Telephone Encounter (Signed)
done

## 2020-09-08 ENCOUNTER — Ambulatory Visit: Payer: Medicaid - Out of State | Admitting: Orthopedic Surgery

## 2020-09-10 ENCOUNTER — Other Ambulatory Visit: Payer: Self-pay

## 2020-09-10 ENCOUNTER — Emergency Department (HOSPITAL_COMMUNITY): Payer: Medicaid - Out of State

## 2020-09-10 ENCOUNTER — Observation Stay (HOSPITAL_COMMUNITY)
Admission: EM | Admit: 2020-09-10 | Discharge: 2020-09-11 | Disposition: A | Discharge status: Home / Self Care | Payer: Medicaid - Out of State | Attending: Family Medicine | Admitting: Family Medicine

## 2020-09-10 ENCOUNTER — Encounter (HOSPITAL_COMMUNITY): Payer: Self-pay | Admitting: Emergency Medicine

## 2020-09-10 DIAGNOSIS — R0789 Other chest pain: Secondary | ICD-10-CM | POA: Diagnosis not present

## 2020-09-10 DIAGNOSIS — D72829 Elevated white blood cell count, unspecified: Secondary | ICD-10-CM

## 2020-09-10 DIAGNOSIS — R079 Chest pain, unspecified: Secondary | ICD-10-CM | POA: Diagnosis present

## 2020-09-10 DIAGNOSIS — E872 Acidosis, unspecified: Secondary | ICD-10-CM

## 2020-09-10 DIAGNOSIS — F17210 Nicotine dependence, cigarettes, uncomplicated: Secondary | ICD-10-CM | POA: Diagnosis not present

## 2020-09-10 DIAGNOSIS — I959 Hypotension, unspecified: Principal | ICD-10-CM

## 2020-09-10 DIAGNOSIS — E871 Hypo-osmolality and hyponatremia: Secondary | ICD-10-CM | POA: Diagnosis not present

## 2020-09-10 DIAGNOSIS — I10 Essential (primary) hypertension: Secondary | ICD-10-CM | POA: Diagnosis not present

## 2020-09-10 DIAGNOSIS — E782 Mixed hyperlipidemia: Secondary | ICD-10-CM

## 2020-09-10 DIAGNOSIS — K219 Gastro-esophageal reflux disease without esophagitis: Secondary | ICD-10-CM

## 2020-09-10 DIAGNOSIS — I2510 Atherosclerotic heart disease of native coronary artery without angina pectoris: Secondary | ICD-10-CM

## 2020-09-10 DIAGNOSIS — A419 Sepsis, unspecified organism: Secondary | ICD-10-CM

## 2020-09-10 DIAGNOSIS — N179 Acute kidney failure, unspecified: Secondary | ICD-10-CM

## 2020-09-10 DIAGNOSIS — E1165 Type 2 diabetes mellitus with hyperglycemia: Secondary | ICD-10-CM | POA: Diagnosis not present

## 2020-09-10 DIAGNOSIS — Z20822 Contact with and (suspected) exposure to covid-19: Secondary | ICD-10-CM | POA: Diagnosis not present

## 2020-09-10 DIAGNOSIS — E785 Hyperlipidemia, unspecified: Secondary | ICD-10-CM

## 2020-09-10 DIAGNOSIS — R6521 Severe sepsis with septic shock: Secondary | ICD-10-CM

## 2020-09-10 LAB — COMPREHENSIVE METABOLIC PANEL
ALT: 14 U/L (ref 0–44)
AST: 17 U/L (ref 15–41)
Albumin: 3.8 g/dL (ref 3.5–5.0)
Alkaline Phosphatase: 90 U/L (ref 38–126)
Anion gap: 12 (ref 5–15)
BUN: 24 mg/dL — ABNORMAL HIGH (ref 6–20)
CO2: 22 mmol/L (ref 22–32)
Calcium: 9.3 mg/dL (ref 8.9–10.3)
Chloride: 96 mmol/L — ABNORMAL LOW (ref 98–111)
Creatinine, Ser: 1.48 mg/dL — ABNORMAL HIGH (ref 0.61–1.24)
GFR, Estimated: 56 mL/min — ABNORMAL LOW (ref >60)
Glucose, Bld: 275 mg/dL — ABNORMAL HIGH (ref 70–99)
Potassium: 4.3 mmol/L (ref 3.5–5.1)
Sodium: 130 mmol/L — ABNORMAL LOW (ref 135–145)
Total Bilirubin: 0.6 mg/dL (ref 0.3–1.2)
Total Protein: 7.5 g/dL (ref 6.5–8.1)

## 2020-09-10 LAB — CBC WITH DIFFERENTIAL/PLATELET
Abs Immature Granulocytes: 0.11 10*3/uL — ABNORMAL HIGH (ref 0.00–0.07)
Basophils Absolute: 0.1 10*3/uL (ref 0.0–0.1)
Basophils Relative: 1 %
Eosinophils Absolute: 0.3 10*3/uL (ref 0.0–0.5)
Eosinophils Relative: 2 %
HCT: 47.7 % (ref 39.0–52.0)
Hemoglobin: 16.5 g/dL (ref 13.0–17.0)
Immature Granulocytes: 1 %
Lymphocytes Relative: 27 %
Lymphs Abs: 4.5 10*3/uL — ABNORMAL HIGH (ref 0.7–4.0)
MCH: 31.4 pg (ref 26.0–34.0)
MCHC: 34.6 g/dL (ref 30.0–36.0)
MCV: 90.7 fL (ref 80.0–100.0)
Monocytes Absolute: 1 10*3/uL (ref 0.1–1.0)
Monocytes Relative: 6 %
Neutro Abs: 10.5 10*3/uL — ABNORMAL HIGH (ref 1.7–7.7)
Neutrophils Relative %: 63 %
Platelets: 324 10*3/uL (ref 150–400)
RBC: 5.26 MIL/uL (ref 4.22–5.81)
RDW: 12.8 % (ref 11.5–15.5)
WBC: 16.5 10*3/uL — ABNORMAL HIGH (ref 4.0–10.5)
nRBC: 0 % (ref 0.0–0.2)

## 2020-09-10 LAB — TROPONIN I (HIGH SENSITIVITY)
Troponin I (High Sensitivity): 5 ng/L (ref <18)
Troponin I (High Sensitivity): 4 ng/L (ref <18)

## 2020-09-10 LAB — PROTIME-INR
INR: 1 (ref 0.8–1.2)
Prothrombin Time: 13.2 seconds (ref 11.4–15.2)

## 2020-09-10 LAB — CBG MONITORING, ED: Glucose-Capillary: 314 mg/dL — ABNORMAL HIGH (ref 70–99)

## 2020-09-10 LAB — LACTIC ACID, PLASMA
Lactic Acid, Venous: 2.6 mmol/L (ref 0.5–1.9)
Lactic Acid, Venous: 1.3 mmol/L (ref 0.5–1.9)

## 2020-09-10 LAB — RESP PANEL BY RT-PCR (FLU A&B, COVID) ARPGX2
Influenza A by PCR: NEGATIVE
Influenza B by PCR: NEGATIVE
SARS Coronavirus 2 by RT PCR: NEGATIVE

## 2020-09-10 LAB — APTT: aPTT: 26 seconds (ref 24–36)

## 2020-09-10 MED ORDER — VANCOMYCIN HCL IN DEXTROSE 1-5 GM/200ML-% IV SOLN
1000.0000 mg | Freq: Once | INTRAVENOUS | Status: AC
  Administered 2020-09-10: 1000 mg via INTRAVENOUS
  Filled 2020-09-10: qty 200

## 2020-09-10 MED ORDER — LACTATED RINGERS IV SOLN: INTRAVENOUS | Status: DC

## 2020-09-10 MED ORDER — VANCOMYCIN HCL 750 MG/150ML IV SOLN
750.0000 mg | Freq: Two times a day (BID) | INTRAVENOUS | Status: DC
  Filled 2020-09-10 (×3): qty 150

## 2020-09-10 MED ORDER — SODIUM CHLORIDE 0.9 % IV SOLN
2.0000 g | Freq: Once | INTRAVENOUS | Status: AC
  Administered 2020-09-10: 2 g via INTRAVENOUS
  Filled 2020-09-10: qty 2

## 2020-09-10 MED ORDER — VANCOMYCIN HCL 500 MG/100ML IV SOLN
500.0000 mg | Freq: Once | INTRAVENOUS | Status: AC
  Administered 2020-09-10: 500 mg via INTRAVENOUS
  Filled 2020-09-10: qty 100

## 2020-09-10 MED ORDER — SODIUM CHLORIDE 0.9 % IV SOLN
2.0000 g | Freq: Two times a day (BID) | INTRAVENOUS | Status: DC
  Administered 2020-09-11: 2 g via INTRAVENOUS
  Filled 2020-09-10: qty 2

## 2020-09-10 MED ORDER — SODIUM CHLORIDE 0.9 % IV BOLUS
1000.0000 mL | Freq: Once | INTRAVENOUS | Status: AC
  Administered 2020-09-10: 1000 mL via INTRAVENOUS

## 2020-09-10 MED ORDER — HEPARIN SODIUM (PORCINE) 5000 UNIT/ML IJ SOLN
5000.0000 [IU] | Freq: Three times a day (TID) | INTRAMUSCULAR | Status: DC
  Administered 2020-09-11: 5000 [IU] via SUBCUTANEOUS
  Filled 2020-09-10: qty 1

## 2020-09-10 MED ORDER — SODIUM CHLORIDE 0.9 % IV BOLUS
2000.0000 mL | Freq: Once | INTRAVENOUS | Status: AC
  Administered 2020-09-10: 2000 mL via INTRAVENOUS

## 2020-09-10 NOTE — Progress Notes (Signed)
Pharmacy Antibiotic Note  Clinton Atkinson is a 54 y.o. male admitted on 09/10/2020 with chest pain.  Pharmacy has been consulted for vancomycin and cefepime dosing. He has chronic left foot wounds. He is afebrile and WBC are elevated. AKI noted, SCr is 1.48 and baseline 0.5-0.8s. Vancomycin 1000 mg IV given at 20:59 and cefepime 2 g IV given at 21:01.  Plan: Vancomycin 1500 mg IV load then 750 mg IV q12h Goal trough 15-20 mcg/ml Cefepime 2 g IV q12h Monitor renal function, clinical progress, cultures/sensitivities F/U LOT and de-escalate as able Vancomycin levels as clinically indicated   Height: 5\' 9"  (175.3 cm) Weight: 77.1 kg (170 lb) IBW/kg (Calculated) : 70.7  Temp (24hrs), Avg:97.5 F (36.4 C), Min:97.5 F (36.4 C), Max:97.5 F (36.4 C)  Recent Labs  Lab 09/10/20 1942  WBC 16.5*  CREATININE 1.48*    Estimated Creatinine Clearance: 57.7 mL/min (A) (by C-G formula based on SCr of 1.48 mg/dL (H)).    Allergies  Allergen Reactions  . Doxycycline Hyclate Diarrhea  . Metformin Diarrhea    Stomach cramp/inability to eat  . Ultram [Tramadol] Itching    Antimicrobials this admission: vancomycin 12/15 >>  cefepime 12/15 >>   Dose adjustments this admission:   Microbiology results: 12/15 BCx:  12/15 UCx:    Thank you for involving pharmacy in this patient's care.  1/16, PharmD, BCPS Clinical Pharmacist Clinical phone for 09/10/2020 until 10p is [phone] 09/10/2020 8:59 PM  **Pharmacist phone directory can be found on [email or web address] listed under [facility]**

## 2020-09-10 NOTE — Sepsis Progress Note (Signed)
Following for sepsis monitoring. # hour bundle complete. Will monitor for the remainder of the 6 hours.

## 2020-09-10 NOTE — ED Provider Notes (Signed)
Emergency Department Provider Note   I have reviewed the triage vital signs and the nursing notes.   HISTORY  Chief Complaint Chest Pain   HPI Clinton Atkinson is a 54 y.o. male with complicated PMH including IDDM, chronic left foot wounds on Bactrim, HLD, and HTN presents to the ED with CP starting today with lightheadedness. He took no meds PTA. Reports feeling disoriented and lightheaded in addition to his chest pain.  The pain is just left of center and radiates to the left shoulder.  Denies fever, chills, cough.  No radiation of symptoms or other modifying factors.  Denies abdominal pain, back pain, vomiting, diarrhea.  I was called to triage to evaluate the patient with arrival blood pressure of 68/58. Chronic pain in the left foot not significantly worse today. Denies EtOH or drug use. Notes that he is supposed to be on abx for his foot but stopped taking them due to diarrhea. Had surgery with Dr. Lajoyce Corners last on 07/04/20 for debridement of left ankle skin graft.   Past Medical History:  Diagnosis Date  . Anxiety   . Arthritis   . COPD (chronic obstructive pulmonary disease) (HCC)   . Coronary artery disease   . Diabetes (HCC)   . Diabetes (HCC) 10/06/2016  . GERD (gastroesophageal reflux disease)   . GERD (gastroesophageal reflux disease) 10/06/2016  . Headache   . High cholesterol 10/06/2016  . Hyperlipemia   . Hypertension   . Neuropathy   . Pneumonia    walking pneumonia  . Stroke [facility])    TIA - 20 years ago    Patient Active Problem List   Diagnosis Date Noted  . Wound of left ankle   . Infected wound 06/27/2020  . Diabetes (HCC) 10/06/2016  . High cholesterol 10/06/2016  . GERD (gastroesophageal reflux disease) 10/06/2016  . Pharyngeal dysphagia 10/06/2016  . Gastroesophageal reflux disease with esophagitis 10/06/2016    Past Surgical History:  Procedure Laterality Date  . APPENDECTOMY    . APPLICATION OF WOUND VAC Left 06/28/2020   Procedure: APPLICATION OF  WOUND VAC;  Surgeon: Sheral Apley, MD;  Location: [facility] OR;  Service: Orthopedics;  Laterality: Left;  . APPLICATION OF WOUND VAC Left 07/04/2020   Procedure: APPLICATION OF WOUND VAC;  Surgeon: Nadara Mustard, MD;  Location: [facility] OR;  Service: Orthopedics;  Laterality: Left;  . EXPLORATORY LAPAROTOMY    . FOOT AMPUTATION    . I & D EXTREMITY Left 06/28/2020   Procedure: IRRIGATION AND DEBRIDEMENT EXTREMITY;  Surgeon: Sheral Apley, MD;  Location: [facility] OR;  Service: Orthopedics;  Laterality: Left;  . I & D EXTREMITY Left 07/04/2020   Procedure: DEBRIDEMENT LEFT ANKLE AND SKIN GRAFT;  Surgeon: Nadara Mustard, MD;  Location: [facility] OR;  Service: Orthopedics;  Laterality: Left;  . UMBILICAL HERNIA REPAIR    . UPPER GASTROINTESTINAL ENDOSCOPY      Allergies Doxycycline hyclate, Metformin, and Ultram [tramadol]  History reviewed. No pertinent family history.  Social History Social History   Tobacco Use  . Smoking status: Current Every Day Smoker    Packs/day: 1.00    Types: Cigarettes  . Smokeless tobacco: Never Used  Substance Use Topics  . Alcohol use: Not Currently    Comment: vodka  . Drug use: Yes    Types: Marijuana    Comment: used a joint 07/02/20    Review of Systems  Constitutional: No fever/chills. Positive fatigue and weakness.  Eyes: No visual changes. ENT: No  sore throat. Cardiovascular: Positive chest pain. Respiratory: Denies shortness of breath. Gastrointestinal: No abdominal pain. No nausea, no vomiting. No diarrhea. No constipation. Genitourinary: Negative for dysuria. Musculoskeletal: Negative for back pain. Chronic left foot pain.  Skin: Two wounds to the left foot.  Neurological: Negative for headaches, focal weakness or numbness.  10-point ROS otherwise negative.  ____________________________________________   PHYSICAL EXAM:  VITAL SIGNS: ED Triage Vitals  Enc Vitals Group     BP 09/10/20 1856 (!) 68/58     Pulse Rate 09/10/20 1856 (!) 105      Resp 09/10/20 1856 17     Temp 09/10/20 1856 (!) 97.5 F (36.4 C)     Temp Source 09/10/20 1856 Oral     SpO2 09/10/20 1856 100 %     Weight 09/10/20 1858 170 lb (77.1 kg)     Height 09/10/20 1858 5\' 9"  (1.753 m)   Constitutional: Alert and oriented. Well appearing and in no acute distress. Fully conversational.  Eyes: Conjunctivae are normal. PERRL.  Head: Atraumatic. Nose: No congestion/rhinnorhea. Mouth/Throat: Mucous membranes are moist.  Neck: No stridor.   Cardiovascular: Tachycardia. Good peripheral circulation. Grossly normal heart sounds.   Respiratory: Normal respiratory effort.  No retractions. Lungs CTAB. Gastrointestinal: Soft and nontender. No distention.  Musculoskeletal: No lower extremity tenderness nor edema. No gross deformities of extremities. Neurologic:  Normal speech and language. No gross focal neurologic deficits are appreciated.  Skin:  Skin is warm and dry. 1 cm wound to the left lateral foot and a second ulceration 2x3 cm area over the lateral malleolus.   ____________________________________________   LABS (all labs ordered are listed, but only abnormal results are displayed)  Labs Reviewed  LACTIC ACID, PLASMA - Abnormal; Notable for the following components:      Result Value   Lactic Acid, Venous 2.6 (*)    All other components within normal limits  COMPREHENSIVE METABOLIC PANEL - Abnormal; Notable for the following components:   Sodium 130 (*)    Chloride 96 (*)    Glucose, Bld 275 (*)    BUN 24 (*)    Creatinine, Ser 1.48 (*)    GFR, Estimated 56 (*)    All other components within normal limits  CBC WITH DIFFERENTIAL/PLATELET - Abnormal; Notable for the following components:   WBC 16.5 (*)    Neutro Abs 10.5 (*)    Lymphs Abs 4.5 (*)    Abs Immature Granulocytes 0.11 (*)    All other components within normal limits  CBG MONITORING, ED - Abnormal; Notable for the following components:   Glucose-Capillary 314 (*)    All other components  within normal limits  RESP PANEL BY RT-PCR (FLU A&B, COVID) ARPGX2  CULTURE, BLOOD (ROUTINE X 2)  CULTURE, BLOOD (ROUTINE X 2)  URINE CULTURE  PROTIME-INR  APTT  LACTIC ACID, PLASMA  URINALYSIS, ROUTINE W REFLEX MICROSCOPIC  TROPONIN I (HIGH SENSITIVITY)  TROPONIN I (HIGH SENSITIVITY)   ____________________________________________  EKG   EKG Interpretation  Date/Time:  Wednesday September 10 2020 18:47:23 EST Ventricular Rate:  100 PR Interval:  154 QRS Duration: 86 QT Interval:  354 QTC Calculation: 456 R Axis:   62 Text Interpretation: Normal sinus rhythm Normal ECG No STEMI Confirmed by [id number] [phone]) on 09/10/2020 7:10:11 PM       ____________________________________________  RADIOLOGY  DG Chest Port 1 View  Result Date: 09/10/2020 CLINICAL DATA:  Sepsis, chest pain, hypertension EXAM: PORTABLE CHEST 1 VIEW COMPARISON:  None. FINDINGS: 16 mm  nodule is seen at the left costophrenic angle, indeterminate. Interstitial prominence at the lung bases may reflect mild underlying fibrotic change. The lungs are otherwise clear. No pneumothorax or pleural effusion. Cardiac size within normal limits. Pulmonary vascularity is normal. No acute bone abnormality. IMPRESSION: No active disease. 16 mm pulmonary nodule at the left lung base, indeterminate. This would better assessed with dedicated CT examination once the patient's acute issues have resolved. Electronically Signed   By: Helyn Numbers MD   On: 09/10/2020 19:23   DG Foot Complete Left  Result Date: 09/10/2020 CLINICAL DATA:  Foot wound EXAM: LEFT FOOT - COMPLETE 3+ VIEW COMPARISON:  08/06/2020 FINDINGS: No fracture or malalignment. Ulcer adjacent to the fifth MTP joint. No definitive osseous destructive change. IMPRESSION: Ulcer adjacent to the fifth MTP joint. No definitive acute osseous abnormality. Electronically Signed   By: Jasmine Pang M.D.   On: 09/10/2020 19:35     ____________________________________________   PROCEDURES  Procedure(s) performed:   .Critical Care Performed by: Maia Plan, MD Authorized by: Maia Plan, MD   Critical care provider statement:    Critical care time (minutes):  50   Critical care time was exclusive of:  Separately billable procedures and treating other patients and teaching time   Critical care was necessary to treat or prevent imminent or life-threatening deterioration of the following conditions:  Shock and sepsis   Critical care was time spent personally by me on the following activities:  Discussions with consultants, evaluation of patient's response to treatment, examination of patient, ordering and performing treatments and interventions, ordering and review of laboratory studies, ordering and review of radiographic studies, pulse oximetry, re-evaluation of patient's condition, obtaining history from patient or surrogate, review of old charts, blood draw for specimens and development of treatment plan with patient or surrogate   I assumed direction of critical care for this patient from another provider in my specialty: no       ____________________________________________   INITIAL IMPRESSION / ASSESSMENT AND PLAN / ED COURSE  Pertinent labs & imaging results that were available during my care of the patient were reviewed by me and considered in my medical decision making (see chart for details).   Patient arrives to the emergency department with chest pain and hypotension.  Despite his blood pressure he looks very well and is fully conversational.  He is afebrile but does have mild tachycardia.  Plan for IV fluids and sepsis work-up. ACS lower on my differential but will send troponin. No ischemic changes on EKG. PE is a consideration. Pain is not pleuritic. Much lower suspicion for dissection or aneurysm.   10:30 PM  Patient's blood pressure responding well to IV fluids.  Broad-spectrum  antibiotics started.  No clear source of infection at this time other than wounds to the left foot.  Patient has stopped taking antibiotics which she was supposed to be on following his last wound debridement of the foot.  He does have weak but palpable DP and PT pulses.  No findings to suspect acute limb ischemia.  No other clear source of infection. Remains afebrile here. Hypotension could be secondary to volume loss from diarrhea but no severe symptoms recently per patient. Lactate 2.6. No acute ischemic change on EKG to suspect ACS and troponin is 5. On reassessment the patient's CP has resolved.   Discussed patient's case with [facility] to request admission. Patient and family (if present) updated with plan. Care transferred to [facility] service.  I reviewed all  nursing notes, vitals, pertinent old records, EKGs, labs, imaging (as available).  ____________________________________________  FINAL CLINICAL IMPRESSION(S) / ED DIAGNOSES  Final diagnoses:  Sepsis with acute organ dysfunction and septic shock, due to unspecified organism, unspecified type (HCC)     MEDICATIONS GIVEN DURING THIS VISIT:  Medications  lactated ringers infusion (has no administration in time range)  vancomycin (VANCOREADY) IVPB 500 mg/100 mL (has no administration in time range)  ceFEPIme (MAXIPIME) 2 g in sodium chloride 0.9 % 100 mL IVPB (has no administration in time range)  vancomycin (VANCOREADY) IVPB 750 mg/150 mL (has no administration in time range)  sodium chloride 0.9 % bolus 1,000 mL (0 mLs Intravenous Stopped 09/10/20 2055)  sodium chloride 0.9 % bolus 2,000 mL (2,000 mLs Intravenous New Bag/Given 09/10/20 2055)  ceFEPIme (MAXIPIME) 2 g in sodium chloride 0.9 % 100 mL IVPB (0 g Intravenous Stopped 09/10/20 2131)  vancomycin (VANCOCIN) IVPB 1000 mg/200 mL premix (1,000 mg Intravenous New Bag/Given 09/10/20 2059)    Note:  This document was prepared using Dragon voice recognition software and may include  unintentional dictation errors.  Alona BeneJoshua Recardo Linn, MD, [facility] Emergency Medicine    Shanyah Gattuso, Arlyss RepressJoshua G, MD 09/10/20 2300

## 2020-09-10 NOTE — H&P (Signed)
History and Physical  Clinton Atkinson:[id number] DOB: [birth year] DOA: 09/10/2020  Referring physician: Maia Plan, MD PCP: Quintin Alto, NP  Patient coming from: Home  Chief Complaint: Lightheadedness and reproducible chest pain  HPI: Clinton Atkinson is a 54 y.o. male with medical history significant for type 2 diabetes mellitus, insulin-dependent, CAD, hypertension, hyperlipidemia, foot wound on Bactrim who presents to the emergency department due to reproducible chest pain and lightheadedness that started today.  Patient complained of lightheadedness and disorientation which was associated with left-sided reproducible chest pain with radiation to left shoulder which started this morning.  He states that he was placed on different antibiotics due to the chronic left wound, he endorsed diarrhea with the first 2 antibiotics prior to being changed to Bactrim, last diarrheal episode was about a week ago.  Patient states that he presented to [facility] ED 2 days ago due to pain from left wound, he states that an IV antibiotic was given and was discharged home.  Patient had surgery with Dr. Lajoyce Corners on 07/04/2020 for debridement of left ankle skin graft per ED medical record, he states that he never stopped taking his Bactrim and that last dose was last night (12/14) He denies nausea, vomiting, abdominal pain, back pain, fever or chills  ED Course:  In the emergency department, BP was 68/58.  Temperature 97.35F.  Work-up in the ED showed leukocytosis.  Hyponatremia, BUN to creatinine 24/1.48 (baseline creatinine 0.5-0.8).  Lactic acid 2.6 > 1.3. Left foot x-ray showed an ulcer adjacent to the fifth MTP joint with no definitive acute osseous abnormality. Chest x-ray showed no active disease. Patient was empirically treated with IV vancomycin and IV cefepime, IV hydration was provided. Hospitalist was asked to admit patient for further evaluation and management.  Review of Systems: Constitutional:  Negative for chills and fever.  HENT: Negative for ear pain and sore throat.   Eyes: Negative for pain and visual disturbance.  Respiratory: Negative for cough, chest tightness and shortness of breath.   Cardiovascular: Resolved reproducible chest pain.  Negative for palpitations.  Gastrointestinal: Negative for abdominal pain and vomiting.  Endocrine: Negative for polyphagia and polyuria.  Genitourinary: Negative for decreased urine volume, dysuria, enuresis, hematuria Musculoskeletal: Negative for arthralgias and back pain.  Skin: Negative for color change and rash.  Allergic/Immunologic: Negative for immunocompromised state.  Neurological: Negative for tremors, syncope, speech difficulty, weakness, light-headedness and headaches.  Hematological: Does not bruise/bleed easily.  All other systems reviewed and are negative  Past Medical History:  Diagnosis Date  . Anxiety   . Arthritis   . COPD (chronic obstructive pulmonary disease) (HCC)   . Coronary artery disease   . Diabetes (HCC)   . Diabetes (HCC) 10/06/2016  . GERD (gastroesophageal reflux disease)   . GERD (gastroesophageal reflux disease) 10/06/2016  . Headache   . High cholesterol 10/06/2016  . Hyperlipemia   . Hypertension   . Neuropathy   . Pneumonia    walking pneumonia  . Stroke [name])    TIA - 20 years ago   Past Surgical History:  Procedure Laterality Date  . APPENDECTOMY    . APPLICATION OF WOUND VAC Left 06/28/2020   Procedure: APPLICATION OF WOUND VAC;  Surgeon: Sheral Apley, MD;  Location: [facility] OR;  Service: Orthopedics;  Laterality: Left;  . APPLICATION OF WOUND VAC Left 07/04/2020   Procedure: APPLICATION OF WOUND VAC;  Surgeon: Nadara Mustard, MD;  Location: [facility] OR;  Service: Orthopedics;  Laterality: Left;  .  EXPLORATORY LAPAROTOMY    . FOOT AMPUTATION    . I & D EXTREMITY Left 06/28/2020   Procedure: IRRIGATION AND DEBRIDEMENT EXTREMITY;  Surgeon: Sheral Apley, MD;  Location: [facility] OR;  Service:  Orthopedics;  Laterality: Left;  . I & D EXTREMITY Left 07/04/2020   Procedure: DEBRIDEMENT LEFT ANKLE AND SKIN GRAFT;  Surgeon: Nadara Mustard, MD;  Location: [facility] OR;  Service: Orthopedics;  Laterality: Left;  . UMBILICAL HERNIA REPAIR    . UPPER GASTROINTESTINAL ENDOSCOPY      Social History:  reports that he has been smoking cigarettes. He has been smoking about 1.00 pack per day. He has never used smokeless tobacco. He reports previous alcohol use. He reports current drug use. Drug: Marijuana.   Allergies  Allergen Reactions  . Doxycycline Hyclate Diarrhea  . Metformin Diarrhea    Stomach cramp/inability to eat  . Ultram [Tramadol] Itching    History reviewed. No pertinent family history.   Prior to Admission medications   Medication Sig Start Date End Date Taking? Authorizing Provider  albuterol (VENTOLIN HFA) 108 (90 Base) MCG/ACT inhaler Inhale 2 puffs into the lungs every 6 (six) hours as needed for wheezing or shortness of breath.   Yes [provider]  amitriptyline (ELAVIL) 25 MG tablet Take 50 mg by mouth at bedtime. 06/13/20  Yes [provider]  aspirin (ASPIRIN CHILDRENS) 81 MG chewable tablet Chew 1 tablet (81 mg total) by mouth 2 (two) times daily. For DVT prophylaxis after surgery 06/29/20 06/29/21 Yes McBane, Jerald Kief, PA-C  cetirizine (ZYRTEC) 10 MG tablet Take 10 mg by mouth every evening.   Yes [provider]  DULoxetine (CYMBALTA) 60 MG capsule Take 30 mg by mouth daily.   Yes [provider]  gabapentin (NEURONTIN) 800 MG tablet Take 900 mg by mouth 3 (three) times daily.   Yes [provider]  insulin glargine (LANTUS SOLOSTAR) 100 UNIT/ML Solostar Pen Inject 45 Units into the skin 2 (two) times daily. Patient taking differently: Inject 20 Units into the skin 2 (two) times daily. 06/29/20  Yes Pahwani, Daleen Bo, MD  insulin lispro (HUMALOG) 100 UNIT/ML injection Inject 14 Units into the skin 3 (three) times daily before  meals. With sliding scale   Yes [provider]  losartan (COZAAR) 25 MG tablet Take 25 mg by mouth daily. 06/24/20  Yes [provider]  nitroGLYCERIN (NITROSTAT) 0.4 MG SL tablet Place 0.4 mg under the tongue daily. 06/04/20  Yes [provider]  rOPINIRole (REQUIP) 2 MG tablet Take 1 tablet by mouth daily.   Yes [provider]  ketorolac (TORADOL) 10 MG tablet Take 10 mg by mouth 4 (four) times daily as needed. 06/21/20   [provider]  Multiple Vitamin (MULTIVITAMIN WITH MINERALS) TABS tablet Take 1 tablet by mouth every evening.    [provider]  oxyCODONE-acetaminophen (PERCOCET) 5-325 MG tablet Take 1 tablet by mouth every 6 (six) hours as needed. 08/28/20 08/28/21  Nadara Mustard, MD  pantoprazole (PROTONIX) 40 MG tablet Take 1 tablet (40 mg total) by mouth 2 (two) times daily before a meal. 10/06/16   Setzer, Brand Males, NP  prasugrel (EFFIENT) 10 MG TABS tablet Take 10 mg by mouth daily.    [provider]  rosuvastatin (CRESTOR) 20 MG tablet Take 20 mg by mouth every evening. 09/29/16   [provider]  sitaGLIPtin (JANUVIA) 100 MG tablet Take 100 mg by mouth every evening.     [provider]  sulfamethoxazole-trimethoprim (BACTRIM DS) 800-160 MG tablet Take 1 tablet by mouth 2 (two) times daily. Patient not taking: Reported on 09/10/2020 08/06/20   Persons, [place], [place]  tamsulosin (FLOMAX) 0.4 MG CAPS capsule Take 0.4 mg by mouth daily.    [provider]  Vitamin D, Cholecalciferol, 25 MCG (1000 UT) CAPS Take 1 capsule by mouth daily.    [provider]  zonisamide (ZONEGRAN) 25 MG capsule Take 25 mg by mouth See admin instructions. Take 1 capsules by mouth 1 week, then take 2 capsules at bedtime for 1 week,take 3 capsules at bedtime. 06/09/20   [provider]    Physical Exam: BP 116/80 (BP Location: Left Arm)   Pulse 86   Temp (!) 97.5 F (36.4 C) (Oral)   Resp (!) 22   Ht 5'  9" (1.753 m)   Wt 77.1 kg   SpO2 93%   BMI 25.10 kg/m   . General: 54 y.o. year-old male well developed well nourished in no acute distress.  Alert and oriented x3. Marland Kitchen HEENT: Dry mucous membrane.  NCAT, EOMI . Neck: Supple, trachea medial . Cardiovascular: Regular rate and rhythm with no rubs or gallops.  No thyromegaly or JVD noted.   Marland Kitchen Respiratory: Clear to auscultation with no wheezes or rales. Good inspiratory effort. . Abdomen: Soft nontender nondistended with normal bowel sounds x4 quadrants. . Muskuloskeletal: Right transmetatarsal amputation.  Chronic left foot wound.   . Neuro: CN II-XII intact, strength, sensation, reflexes . Skin: Chronic left foot wound  . Psychiatry: Judgement and insight appear normal. Mood is appropriate for condition and setting          Labs on Admission:  Basic Metabolic Panel: Recent Labs  Lab 09/10/20 1942  NA 130*  K 4.3  CL 96*  CO2 22  GLUCOSE 275*  BUN 24*  CREATININE 1.48*  CALCIUM 9.3   Liver Function Tests: Recent Labs  Lab 09/10/20 1942  AST 17  ALT 14  ALKPHOS 90  BILITOT 0.6  PROT 7.5  ALBUMIN 3.8   No results for input(s): LIPASE, AMYLASE in the last 168 hours. No results for input(s): AMMONIA in the last 168 hours. CBC: Recent Labs  Lab 09/10/20 1942  WBC 16.5*  NEUTROABS 10.5*  HGB 16.5  HCT 47.7  MCV 90.7  PLT 324   Cardiac Enzymes: No results for input(s): CKTOTAL, CKMB, CKMBINDEX, TROPONINI in the last 168 hours.  BNP (last 3 results) No results for input(s): BNP in the last 8760 hours.  ProBNP (last 3 results) No results for input(s): PROBNP in the last 8760 hours.  CBG: Recent Labs  Lab 09/10/20 1857  GLUCAP 314*    Radiological Exams on Admission: DG Chest Port 1 View  Result Date: 09/10/2020 CLINICAL DATA:  Sepsis, chest pain, hypertension EXAM: PORTABLE CHEST 1 VIEW COMPARISON:  None. FINDINGS: 16 mm nodule is seen at the left costophrenic angle, indeterminate. Interstitial prominence  at the lung bases may reflect mild underlying fibrotic change. The lungs are otherwise clear. No pneumothorax or pleural effusion. Cardiac size within normal limits. Pulmonary vascularity is normal. No acute bone abnormality. IMPRESSION: No active disease. 16 mm pulmonary nodule at the left lung base, indeterminate. This would better assessed with dedicated CT examination once the patient's acute issues have resolved. Electronically Signed   By: Helyn Numbers MD   On: 09/10/2020 19:23   DG Foot Complete Left  Result Date: 09/10/2020 CLINICAL DATA:  Foot wound EXAM: LEFT FOOT -  COMPLETE 3+ VIEW COMPARISON:  08/06/2020 FINDINGS: No fracture or malalignment. Ulcer adjacent to the fifth MTP joint. No definitive osseous destructive change. IMPRESSION: Ulcer adjacent to the fifth MTP joint. No definitive acute osseous abnormality. Electronically Signed   By: Jasmine PangKim  Fujinaga M.D.   On: 09/10/2020 19:35    EKG: I independently viewed the EKG done and my findings are as followed: Normal sinus rhythm at a rate of 100 bpm  Assessment/Plan Present on Admission: . Hypotension . GERD (gastroesophageal reflux disease)  Active Problems:   GERD (gastroesophageal reflux disease)   Hypotension   Hyponatremia   AKI (acute kidney injury) (HCC)   Hyperglycemia due to diabetes mellitus (HCC)   Leukocytosis   Lactic acidosis   CAD (coronary artery disease)   Essential hypertension   Hyperlipidemia  Hypotension/dehydration Initial BP was 68/58 and patient states that lightheadedness was seen on standing Patient endorsed having diarrhea with prior antibiotics which could have explained volume loss IV hydration was provided with improvement in BP He was empirically started on IV cefepime and vancomycin due to presumption that patient was septic.  Patient presents with leukocytosis but do not meet other criteria for SIRS, patient does not appear septic clinically, however we shall continue with above antibiotics  with plan to de-escalate/discontinue based on procalcitonin and blood culture. Continue telemetry Continue fall precaution and neurochecks  Atypical chest pain-resolved Chest pain was reproducible per patient Troponin x2 was negative; EKG showed normal sinus rhythm This was possibly pleuritic in nature  Acute kidney injury BUN to creatinine 24/1.48 (baseline creatinine 0.5-0.8) Renally adjust medications, avoid nephrotoxic agents/dehydration/hypotension  Chronic left ankle wound Patient was started on IV antibiotics (IV vancomycin and IV cefepime) Continue wound care  Lactic acidosis-resolved 2.6 > 1.3; this resolved after IV hydration   Hyperglycemia secondary to type 2 diabetes mellitus Continue insulin sliding scale and hypoglycemic protocol  GERD Continue PPI  Essential hypertension BP meds will be held at this time due to hypotension  Hyperlipidemia Continue statin  CAD S/P PCI Continue procedure  BPH Hold Flomax at this time due to hypotension    DVT prophylaxis: Heparin subcu  Code Status: Full code  Family Communication: None at bedside  Disposition Plan:  Patient is from:                        home Anticipated DC to:                   SNF or family members home Anticipated DC date:               2-3 days Anticipated DC barriers:           Patient is unstable to be discharged at this time due to hypotension requiring IV hydration    Consults called: None  Admission status: Inpatient    Frankey Shownladapo Lavaun Greenfield MD [facility]  09/11/2020, 2:05 AM

## 2020-09-10 NOTE — ED Triage Notes (Signed)
Pt c/o chest pain that began this morning. Pt is hypotensive with a BP of 68/58. Charge nurse notified.

## 2020-09-11 ENCOUNTER — Ambulatory Visit: Payer: Medicaid - Out of State | Admitting: Orthopedic Surgery

## 2020-09-11 DIAGNOSIS — E871 Hypo-osmolality and hyponatremia: Secondary | ICD-10-CM

## 2020-09-11 DIAGNOSIS — E872 Acidosis, unspecified: Secondary | ICD-10-CM

## 2020-09-11 DIAGNOSIS — R0789 Other chest pain: Secondary | ICD-10-CM

## 2020-09-11 DIAGNOSIS — E1165 Type 2 diabetes mellitus with hyperglycemia: Secondary | ICD-10-CM

## 2020-09-11 DIAGNOSIS — D72829 Elevated white blood cell count, unspecified: Secondary | ICD-10-CM

## 2020-09-11 DIAGNOSIS — E785 Hyperlipidemia, unspecified: Secondary | ICD-10-CM

## 2020-09-11 DIAGNOSIS — I10 Essential (primary) hypertension: Secondary | ICD-10-CM

## 2020-09-11 DIAGNOSIS — N179 Acute kidney failure, unspecified: Secondary | ICD-10-CM

## 2020-09-11 DIAGNOSIS — I2510 Atherosclerotic heart disease of native coronary artery without angina pectoris: Secondary | ICD-10-CM

## 2020-09-11 DIAGNOSIS — I959 Hypotension, unspecified: Secondary | ICD-10-CM

## 2020-09-11 LAB — COMPREHENSIVE METABOLIC PANEL
ALT: 10 U/L (ref 0–44)
AST: 12 U/L — ABNORMAL LOW (ref 15–41)
Albumin: 2.7 g/dL — ABNORMAL LOW (ref 3.5–5.0)
Alkaline Phosphatase: 66 U/L (ref 38–126)
Anion gap: 5 (ref 5–15)
BUN: 18 mg/dL (ref 6–20)
CO2: 23 mmol/L (ref 22–32)
Calcium: 7.7 mg/dL — ABNORMAL LOW (ref 8.9–10.3)
Chloride: 105 mmol/L (ref 98–111)
Creatinine, Ser: 0.94 mg/dL (ref 0.61–1.24)
GFR, Estimated: >60 mL/min (ref >60)
Glucose, Bld: 213 mg/dL — ABNORMAL HIGH (ref 70–99)
Potassium: 4 mmol/L (ref 3.5–5.1)
Sodium: 133 mmol/L — ABNORMAL LOW (ref 135–145)
Total Bilirubin: 0.3 mg/dL (ref 0.3–1.2)
Total Protein: 5.8 g/dL — ABNORMAL LOW (ref 6.5–8.1)

## 2020-09-11 LAB — HEMOGLOBIN A1C
Hgb A1c MFr Bld: 12.1 % — ABNORMAL HIGH (ref 4.8–5.6)
Mean Plasma Glucose: 300.57 mg/dL

## 2020-09-11 LAB — URINALYSIS, ROUTINE W REFLEX MICROSCOPIC
Bacteria, UA: NONE SEEN
Bilirubin Urine: NEGATIVE
Glucose, UA: >=500 mg/dL — AB
Hgb urine dipstick: NEGATIVE
Ketones, ur: NEGATIVE mg/dL
Leukocytes,Ua: NEGATIVE
Nitrite: NEGATIVE
Protein, ur: NEGATIVE mg/dL
Specific Gravity, Urine: 1.01 (ref 1.005–1.030)
pH: 6 (ref 5.0–8.0)

## 2020-09-11 LAB — CBC
HCT: 42.8 % (ref 39.0–52.0)
Hemoglobin: 14.4 g/dL (ref 13.0–17.0)
MCH: 30.9 pg (ref 26.0–34.0)
MCHC: 33.6 g/dL (ref 30.0–36.0)
MCV: 91.8 fL (ref 80.0–100.0)
Platelets: 257 10*3/uL (ref 150–400)
RBC: 4.66 MIL/uL (ref 4.22–5.81)
RDW: 12.8 % (ref 11.5–15.5)
WBC: 12.7 10*3/uL — ABNORMAL HIGH (ref 4.0–10.5)
nRBC: 0 % (ref 0.0–0.2)

## 2020-09-11 LAB — MAGNESIUM: Magnesium: 1.9 mg/dL (ref 1.7–2.4)

## 2020-09-11 LAB — APTT: aPTT: 26 seconds (ref 24–36)

## 2020-09-11 LAB — PROTIME-INR
INR: 1.1 (ref 0.8–1.2)
Prothrombin Time: 13.9 seconds (ref 11.4–15.2)

## 2020-09-11 LAB — PHOSPHORUS: Phosphorus: 2.9 mg/dL (ref 2.5–4.6)

## 2020-09-11 LAB — CBG MONITORING, ED: Glucose-Capillary: 181 mg/dL — ABNORMAL HIGH (ref 70–99)

## 2020-09-11 LAB — PROCALCITONIN: Procalcitonin: <0.1 ng/mL

## 2020-09-11 MED ORDER — SODIUM CHLORIDE 0.9 % IV SOLN: Freq: Once | INTRAVENOUS | Status: AC

## 2020-09-11 MED ORDER — CEPHALEXIN 500 MG PO CAPS
500.0000 mg | ORAL_CAPSULE | Freq: Four times a day (QID) | ORAL | 0 refills | Status: AC
Start: 2020-09-11 — End: 2020-09-16

## 2020-09-11 MED ORDER — DOXYCYCLINE HYCLATE 100 MG PO TABS: 100.0000 mg | ORAL_TABLET | Freq: Two times a day (BID) | ORAL | 0 refills | Status: DC

## 2020-09-11 MED ORDER — ROSUVASTATIN CALCIUM 20 MG PO TABS
20.0000 mg | ORAL_TABLET | Freq: Every evening | ORAL | Status: DC
  Filled 2020-09-11: qty 1

## 2020-09-11 MED ORDER — PANTOPRAZOLE SODIUM 40 MG PO TBEC
40.0000 mg | DELAYED_RELEASE_TABLET | Freq: Two times a day (BID) | ORAL | Status: DC
  Administered 2020-09-11: 40 mg via ORAL
  Filled 2020-09-11: qty 1

## 2020-09-11 MED ORDER — PRASUGREL HCL 10 MG PO TABS
10.0000 mg | ORAL_TABLET | Freq: Every day | ORAL | Status: DC
  Administered 2020-09-11: 10 mg via ORAL
  Filled 2020-09-11: qty 1

## 2020-09-11 MED ORDER — INSULIN ASPART 100 UNIT/ML ~~LOC~~ SOLN: 0.0000 [IU] | Freq: Every day | SUBCUTANEOUS | Status: DC

## 2020-09-11 MED ORDER — INSULIN ASPART 100 UNIT/ML ~~LOC~~ SOLN
0.0000 [IU] | Freq: Three times a day (TID) | SUBCUTANEOUS | Status: DC
  Administered 2020-09-11: 3 [IU] via SUBCUTANEOUS
  Filled 2020-09-11: qty 1

## 2020-09-11 MED ORDER — DOXYCYCLINE HYCLATE 100 MG PO TABS
100.0000 mg | ORAL_TABLET | Freq: Two times a day (BID) | ORAL | Status: DC
  Administered 2020-09-11: 100 mg via ORAL
  Filled 2020-09-11: qty 1

## 2020-09-11 NOTE — Discharge Summary (Signed)
Physician Discharge Summary  CAHLIL SATTAR QVZ:[id number] DOB: December 01, 1965 DOA: 09/10/2020  PCP: Quintin Alto, NP  Admit date: 09/10/2020 Discharge date: 09/11/2020  Time spent: 27 minutes  Recommendations for Outpatient Follow-up:  1. Needs outpatient follow-up with Dr. Lajoyce Corners I will CC him on this note may have Charcot joint and may require further work-up or debridement 2. Needs CBC and differential as well as Chem-12 and 1 to 2 weeks  Discharge Diagnoses:  Principal Problem:   Hypotension Active Problems:   GERD (gastroesophageal reflux disease)   Hyponatremia   AKI (acute kidney injury) (HCC)   Hyperglycemia due to diabetes mellitus (HCC)   Leukocytosis   Lactic acidosis   CAD (coronary artery disease)   Essential hypertension   Hyperlipidemia   Atypical chest pain   Discharge Condition: Fair  Diet recommendation: Diabetic  Filed Weights   09/10/20 1858  Weight: 77.1 kg    History of present illness:  54 year old white male DM TY 2 Insulin-dependent diabetes mellitus HTN, HLD, restless leg syndrome  Last admitted to the hospital 10/1 through 06/29/2020 with ulceration to the lateral left ankle secondary to spider bite treated outpatient failed outpatient setting underwent incision debridement wound VAC was given at the time and was on Bactrim  Returns to hospital with chest pain lightheadedness diarrhea since being changed to Bactrim and blood pressure 68/58-left foot showed 5th metatarsophalangeal joint ulcer with no osseous abnormality Rx vancomycin cefepime   Hospital Course:  It was felt that patient did not have an overt infection given procalcitonin was low-patient's blood cultures were performed and we will inform him of the grow anything which is unlikely he probably has chronic breakdown of the left leg and does not need prolonged antibiotics but given unsure to diagnosis on discharge I will transition him to p.o. Keflex 4 times daily until he can be  reevaluated by orthopedics Dr. Lajoyce Corners  His hypotension significantly improved his AKI was improved and I discontinued his losartan from prior to admission and he should obviously not take Keflex long-term and should avoid Bactrim long-term as this is probably precipitant   Discharge Exam: Vitals:   09/11/20 0830 09/11/20 0900  BP: 133/84 (!) 133/97  Pulse: 83 86  Resp: 16 15  Temp:    SpO2: 97% 98%    General: EOMI NCAT no focal deficit Cardiovascular: S1-S2 no murmur Respiratory: Clear no added sound      Discharge Instructions   Discharge Instructions    Diet - low sodium heart healthy   Complete by: As directed    Discharge instructions   Complete by: As directed    Make sure that you follow-up with Dr. Lajoyce Corners in the outpatient setting I do not think that you have a chronic underlying infection given the imaging of your leg was negative you may need however work-up by him and may be further scans-he may need to decide on doing some operative management as you may have breakdown of the joint I will place you on Keflex 4 times a day for the next 4 to 5 days please take it and follow-up with him for further instructions  do not take your losartan as this medication probably caused a little bit of kidney failure in addition to the Bactrim and please seek immediate assistance if you have fevers or chills We will call you if we find anything going on your blood cultures although it is unlikely that this was infectious   Increase activity slowly   Complete  by: As directed    No wound care   Complete by: As directed      Allergies as of 09/11/2020      Reactions   Doxycycline Hyclate Diarrhea   Metformin Diarrhea   Stomach cramp/inability to eat   Ultram [tramadol] Itching      Medication List    STOP taking these medications   losartan 25 MG tablet Commonly known as: COZAAR   multivitamin with minerals Tabs tablet   sulfamethoxazole-trimethoprim 800-160 MG  tablet Commonly known as: Bactrim DS     TAKE these medications   albuterol 108 (90 Base) MCG/ACT inhaler Commonly known as: VENTOLIN HFA Inhale 2 puffs into the lungs every 6 (six) hours as needed for wheezing or shortness of breath.   amitriptyline 25 MG tablet Commonly known as: ELAVIL Take 50 mg by mouth at bedtime.   aspirin 81 MG chewable tablet Commonly known as: Aspirin Childrens Chew 1 tablet (81 mg total) by mouth 2 (two) times daily. For DVT prophylaxis after surgery   cephALEXin 500 MG capsule Commonly known as: KEFLEX Take 1 capsule (500 mg total) by mouth 4 (four) times daily for 5 days.   cetirizine 10 MG tablet Commonly known as: ZYRTEC Take 10 mg by mouth every evening.   DULoxetine 60 MG capsule Commonly known as: CYMBALTA Take 30 mg by mouth daily.   gabapentin 800 MG tablet Commonly known as: NEURONTIN Take 900 mg by mouth 3 (three) times daily.   insulin lispro 100 UNIT/ML injection Commonly known as: HUMALOG Inject 14 Units into the skin 3 (three) times daily before meals. With sliding scale   ketorolac 10 MG tablet Commonly known as: TORADOL Take 10 mg by mouth 4 (four) times daily as needed.   Lantus SoloStar 100 UNIT/ML Solostar Pen Generic drug: insulin glargine Inject 45 Units into the skin 2 (two) times daily. What changed: how much to take   nitroGLYCERIN 0.4 MG SL tablet Commonly known as: NITROSTAT Place 0.4 mg under the tongue daily.   oxyCODONE-acetaminophen 5-325 MG tablet Commonly known as: Percocet Take 1 tablet by mouth every 6 (six) hours as needed.   pantoprazole 40 MG tablet Commonly known as: Protonix Take 1 tablet (40 mg total) by mouth 2 (two) times daily before a meal.   prasugrel 10 MG Tabs tablet Commonly known as: EFFIENT Take 10 mg by mouth daily.   rOPINIRole 2 MG tablet Commonly known as: REQUIP Take 1 tablet by mouth daily.   rosuvastatin 20 MG tablet Commonly known as: CRESTOR Take 20 mg by mouth  every evening.   sitaGLIPtin 100 MG tablet Commonly known as: JANUVIA Take 100 mg by mouth every evening.   tamsulosin 0.4 MG Caps capsule Commonly known as: FLOMAX Take 0.4 mg by mouth daily.   Vitamin D (Cholecalciferol) 25 MCG (1000 UT) Caps Take 1 capsule by mouth daily.   zonisamide 25 MG capsule Commonly known as: ZONEGRAN Take 25 mg by mouth See admin instructions. Take 1 capsules by mouth 1 week, then take 2 capsules at bedtime for 1 week,take 3 capsules at bedtime.      Allergies  Allergen Reactions  . Doxycycline Hyclate Diarrhea  . Metformin Diarrhea    Stomach cramp/inability to eat  . Ultram [Tramadol] Itching      The results of significant diagnostics from this hospitalization (including imaging, microbiology, ancillary and laboratory) are listed below for reference.    Significant Diagnostic Studies: DG Chest Port 1 View  Result Date:  09/10/2020 CLINICAL DATA:  Sepsis, chest pain, hypertension EXAM: PORTABLE CHEST 1 VIEW COMPARISON:  None. FINDINGS: 16 mm nodule is seen at the left costophrenic angle, indeterminate. Interstitial prominence at the lung bases may reflect mild underlying fibrotic change. The lungs are otherwise clear. No pneumothorax or pleural effusion. Cardiac size within normal limits. Pulmonary vascularity is normal. No acute bone abnormality. IMPRESSION: No active disease. 16 mm pulmonary nodule at the left lung base, indeterminate. This would better assessed with dedicated CT examination once the patient's acute issues have resolved. Electronically Signed   By: Helyn Numbers MD   On: 09/10/2020 19:23   DG Foot Complete Left  Result Date: 09/10/2020 CLINICAL DATA:  Foot wound EXAM: LEFT FOOT - COMPLETE 3+ VIEW COMPARISON:  08/06/2020 FINDINGS: No fracture or malalignment. Ulcer adjacent to the fifth MTP joint. No definitive osseous destructive change. IMPRESSION: Ulcer adjacent to the fifth MTP joint. No definitive acute osseous abnormality.  Electronically Signed   By: Jasmine Pang M.D.   On: 09/10/2020 19:35    Microbiology: Recent Results (from the past 240 hour(s))  Blood Culture (routine x 2)     Status: None (Preliminary result)   Collection Time: 09/10/20  7:42 PM   Specimen: BLOOD RIGHT FOREARM  Result Value Ref Range Status   Specimen Description BLOOD RIGHT FOREARM  Final   Special Requests   Final    BOTTLES DRAWN AEROBIC AND ANAEROBIC Blood Culture adequate volume   Culture   Final    NO GROWTH < 12 HOURS Performed at [facility], [address] [place], [place] [address]    Report Status PENDING  Incomplete  Resp Panel by RT-PCR (Flu A&B, Covid) Nasopharyngeal Swab     Status: None   Collection Time: 09/10/20  7:50 PM   Specimen: Nasopharyngeal Swab; Nasopharyngeal(NP) swabs in vial transport medium  Result Value Ref Range Status   SARS Coronavirus 2 by RT PCR NEGATIVE NEGATIVE Final    Comment: (NOTE) SARS-CoV-2 target nucleic acids are NOT DETECTED.  The SARS-CoV-2 RNA is generally detectable in upper respiratory specimens during the acute phase of infection. The lowest concentration of SARS-CoV-2 viral copies this assay can detect is 138 copies/mL. A negative result does not preclude SARS-Cov-2 infection and should not be used as the sole basis for treatment or other patient management decisions. A negative result may occur with  improper specimen collection/handling, submission of specimen other than nasopharyngeal swab, presence of viral mutation(s) within the areas targeted by this assay, and inadequate number of viral copies(<138 copies/mL). A negative result must be combined with clinical observations, patient history, and epidemiological information. The expected result is Negative.  Fact Sheet for Patients:  BloggerCourse.com  Fact Sheet for Healthcare Providers:  SeriousBroker.it  This test is no t yet approved or cleared by the [place] FDA and  has been authorized for detection and/or diagnosis of SARS-CoV-2 by FDA under an Emergency Use Authorization (EUA). This EUA will remain  in effect (meaning this test can be used) for the duration of the COVID-19 declaration under Section 564(b)(1) of the Act, 21 U.S.C.section 360bbb-3(b)(1), unless the authorization is terminated  or revoked sooner.       Influenza A by PCR NEGATIVE NEGATIVE Final   Influenza B by PCR NEGATIVE NEGATIVE Final    Comment: (NOTE) The Xpert Xpress SARS-CoV-2/FLU/RSV plus assay is intended as an aid in the diagnosis of influenza from Nasopharyngeal swab specimens and should not be used as a sole basis for  treatment. Nasal washings and aspirates are unacceptable for Xpert Xpress SARS-CoV-2/FLU/RSV testing.  Fact Sheet for Patients: BloggerCourse.comhttps://www.fda.gov/media/152166/download  Fact Sheet for Healthcare Providers: SeriousBroker.ithttps://www.fda.gov/media/152162/download  This test is not yet approved or cleared by the [place] FDA and has been authorized for detection and/or diagnosis of SARS-CoV-2 by FDA under an Emergency Use Authorization (EUA). This EUA will remain in effect (meaning this test can be used) for the duration of the COVID-19 declaration under Section 564(b)(1) of the Act, 21 U.S.C. section 360bbb-3(b)(1), unless the authorization is terminated or revoked.  Performed at [facility], [address] [place], [place] [address]   Blood Culture (routine x 2)     Status: None (Preliminary result)   Collection Time: 09/10/20  7:57 PM   Specimen: BLOOD  Result Value Ref Range Status   Specimen Description BLOOD LEFT ANTECUBITAL  Final   Special Requests   Final    BOTTLES DRAWN AEROBIC AND ANAEROBIC Blood Culture adequate volume   Culture   Final    NO GROWTH < 12 HOURS Performed at [facility], [address] [place], [place] [address]    Report Status PENDING  Incomplete     Labs: Basic Metabolic Panel: Recent Labs  Lab  09/10/20 1942 09/11/20 0348  NA 130* 133*  K 4.3 4.0  CL 96* 105  CO2 22 23  GLUCOSE 275* 213*  BUN 24* 18  CREATININE 1.48* 0.94  CALCIUM 9.3 7.7*  MG  --  1.9  PHOS  --  2.9   Liver Function Tests: Recent Labs  Lab 09/10/20 1942 09/11/20 0348  AST 17 12*  ALT 14 10  ALKPHOS 90 66  BILITOT 0.6 0.3  PROT 7.5 5.8*  ALBUMIN 3.8 2.7*   No results for input(s): LIPASE, AMYLASE in the last 168 hours. No results for input(s): AMMONIA in the last 168 hours. CBC: Recent Labs  Lab 09/10/20 1942 09/11/20 0348  WBC 16.5* 12.7*  NEUTROABS 10.5*  --   HGB 16.5 14.4  HCT 47.7 42.8  MCV 90.7 91.8  PLT 324 257   Cardiac Enzymes: No results for input(s): CKTOTAL, CKMB, CKMBINDEX, TROPONINI in the last 168 hours. BNP: BNP (last 3 results) No results for input(s): BNP in the last 8760 hours.  ProBNP (last 3 results) No results for input(s): PROBNP in the last 8760 hours.  CBG: Recent Labs  Lab 09/10/20 1857 09/11/20 0805  GLUCAP 314* 181*       Signed:  Rhetta MuraJai-Gurmukh Avery Klingbeil MD   [facility] 09/11/2020, 9:31 AM

## 2020-09-11 NOTE — Sepsis Progress Note (Signed)
Sepsis monitoring complete 

## 2020-09-12 LAB — URINE CULTURE: Culture: NO GROWTH

## 2020-09-15 LAB — CULTURE, BLOOD (ROUTINE X 2)
Culture: NO GROWTH
Culture: NO GROWTH
Special Requests: ADEQUATE
Special Requests: ADEQUATE

## 2022-02-07 IMAGING — DX DG ANKLE COMPLETE 3+V*L*
3 series · 3 of 3 positions shown · non-contrast
Comparison: None.

CLINICAL DATA: Ulcerated area to lateral Lt ankle after spider bite
x 8 days ago. Hx diabetes

EXAM:
LEFT ANKLE COMPLETE - 3+ VIEW

[ankle ap]
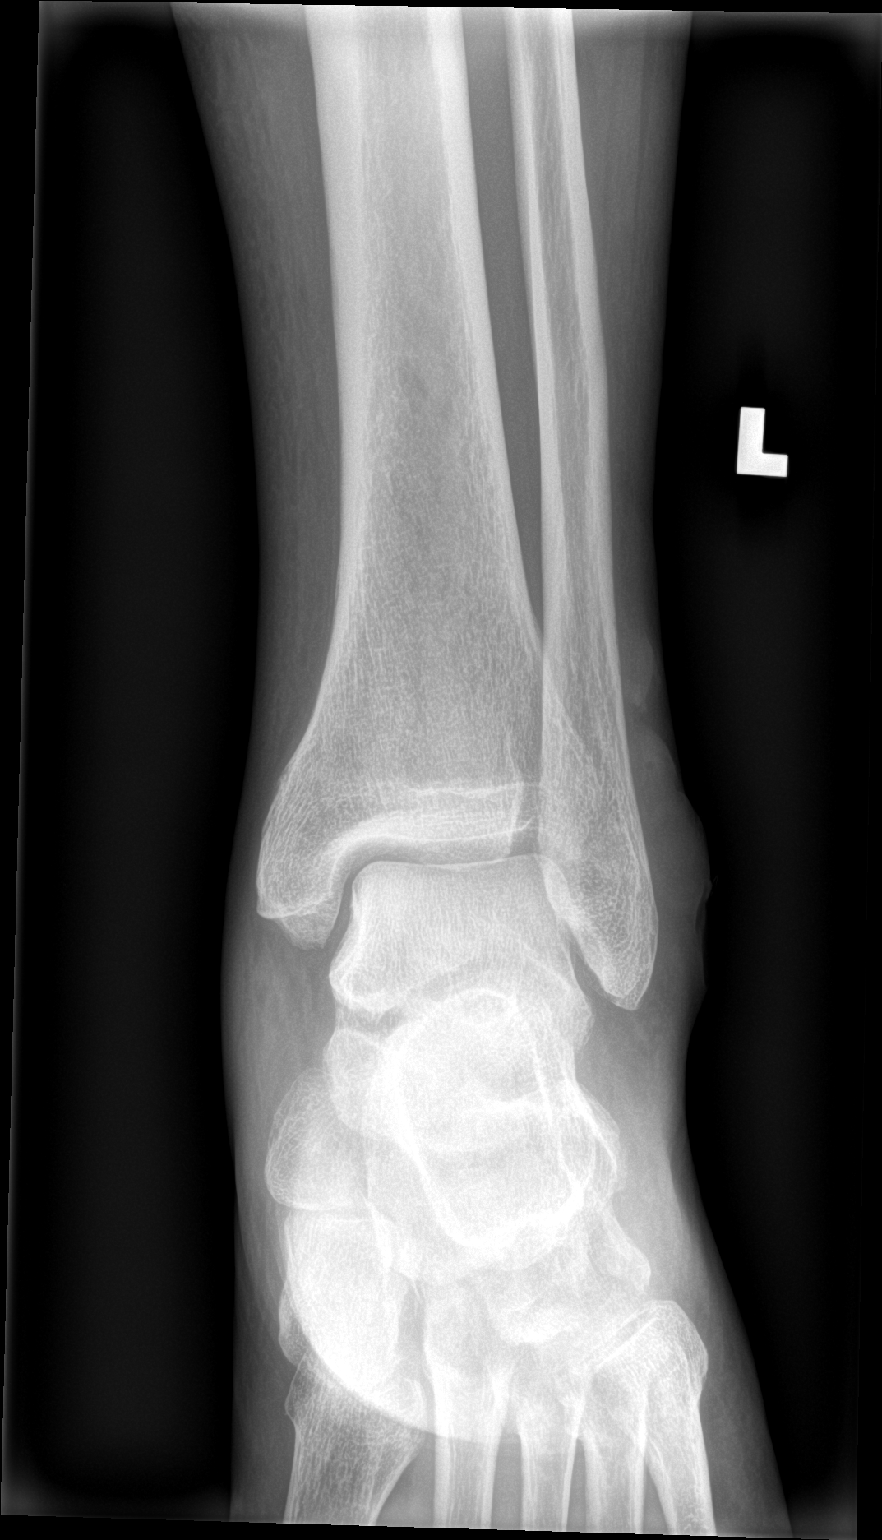

[ankle obl]
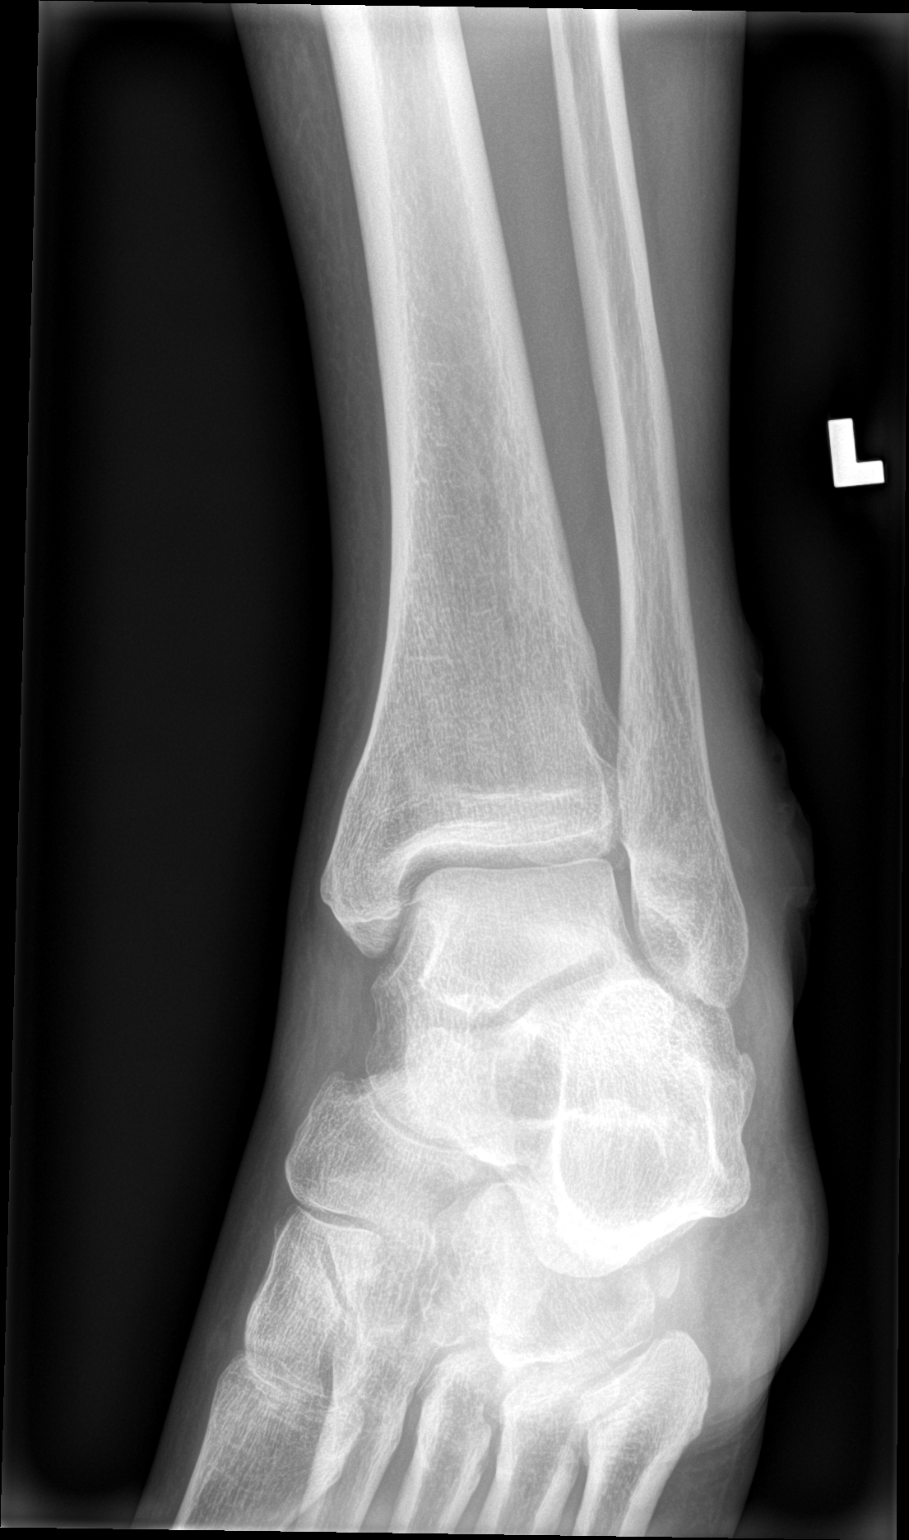

[ankle lat]
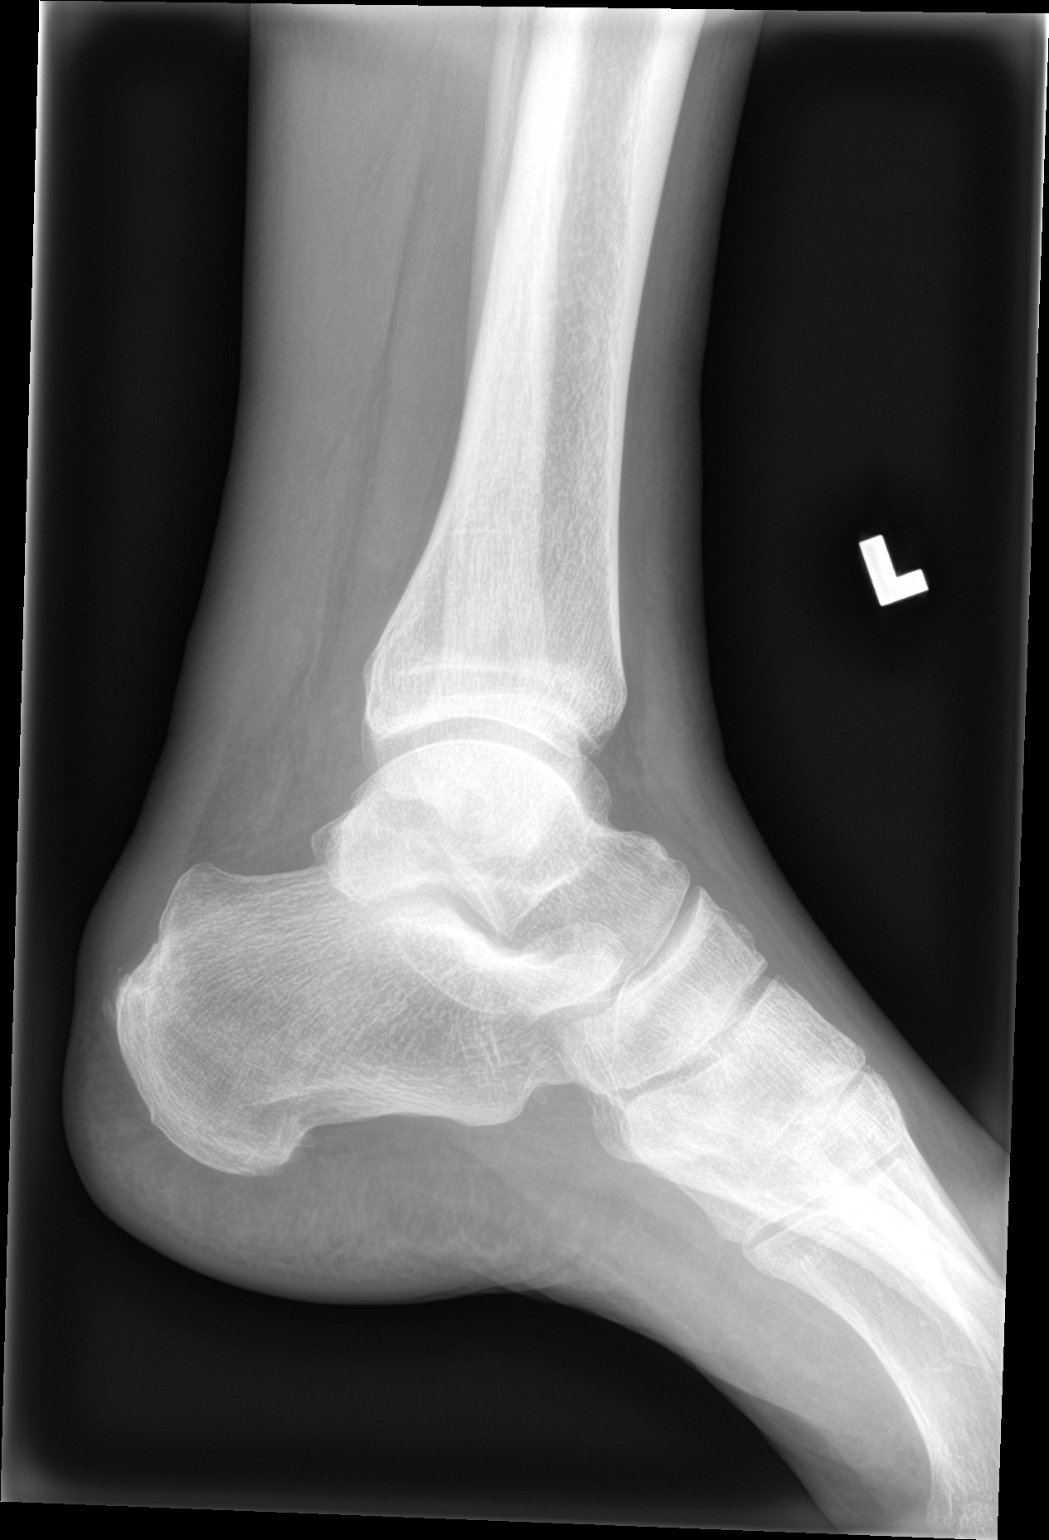

[3 of 3 positions shown; findings below may reference images not displayed]

FINDINGS: There is no evidence of fracture, dislocation, or joint effusion.
There is no focal bone lesion or osseous destruction. Lateral soft
tissue defect likely representing known ulceration.
IMPRESSION: No radiographic evidence of osteomyelitis in the left ankle.

## 2022-04-23 IMAGING — DX DG FOOT COMPLETE 3+V*L*
3 series · 3 of 3 positions shown · non-contrast
Comparison: 08/06/2020

CLINICAL DATA: Foot wound

EXAM:
LEFT FOOT - COMPLETE 3+ VIEW

[foot ap]
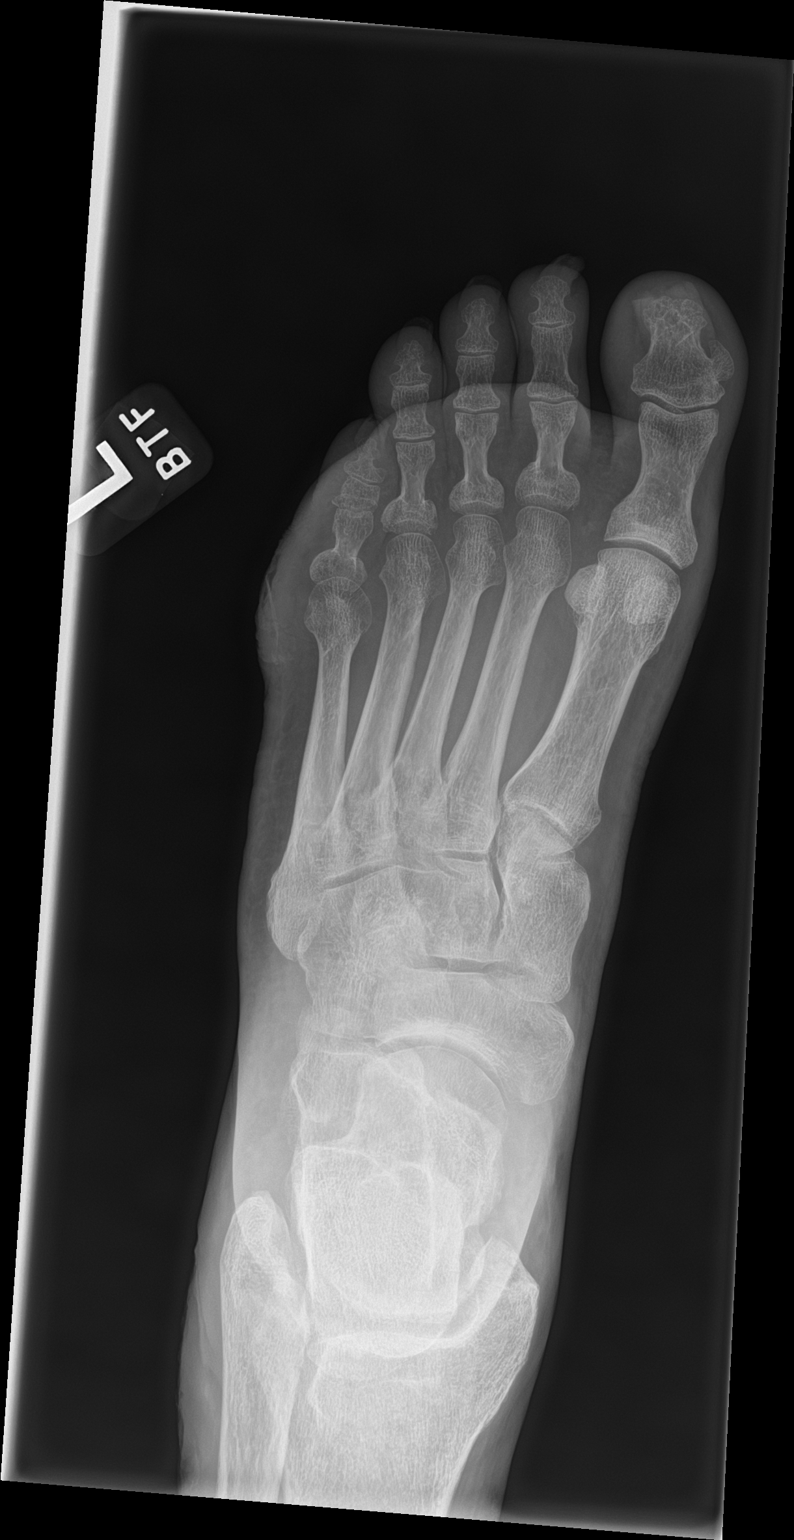

[foot obl]
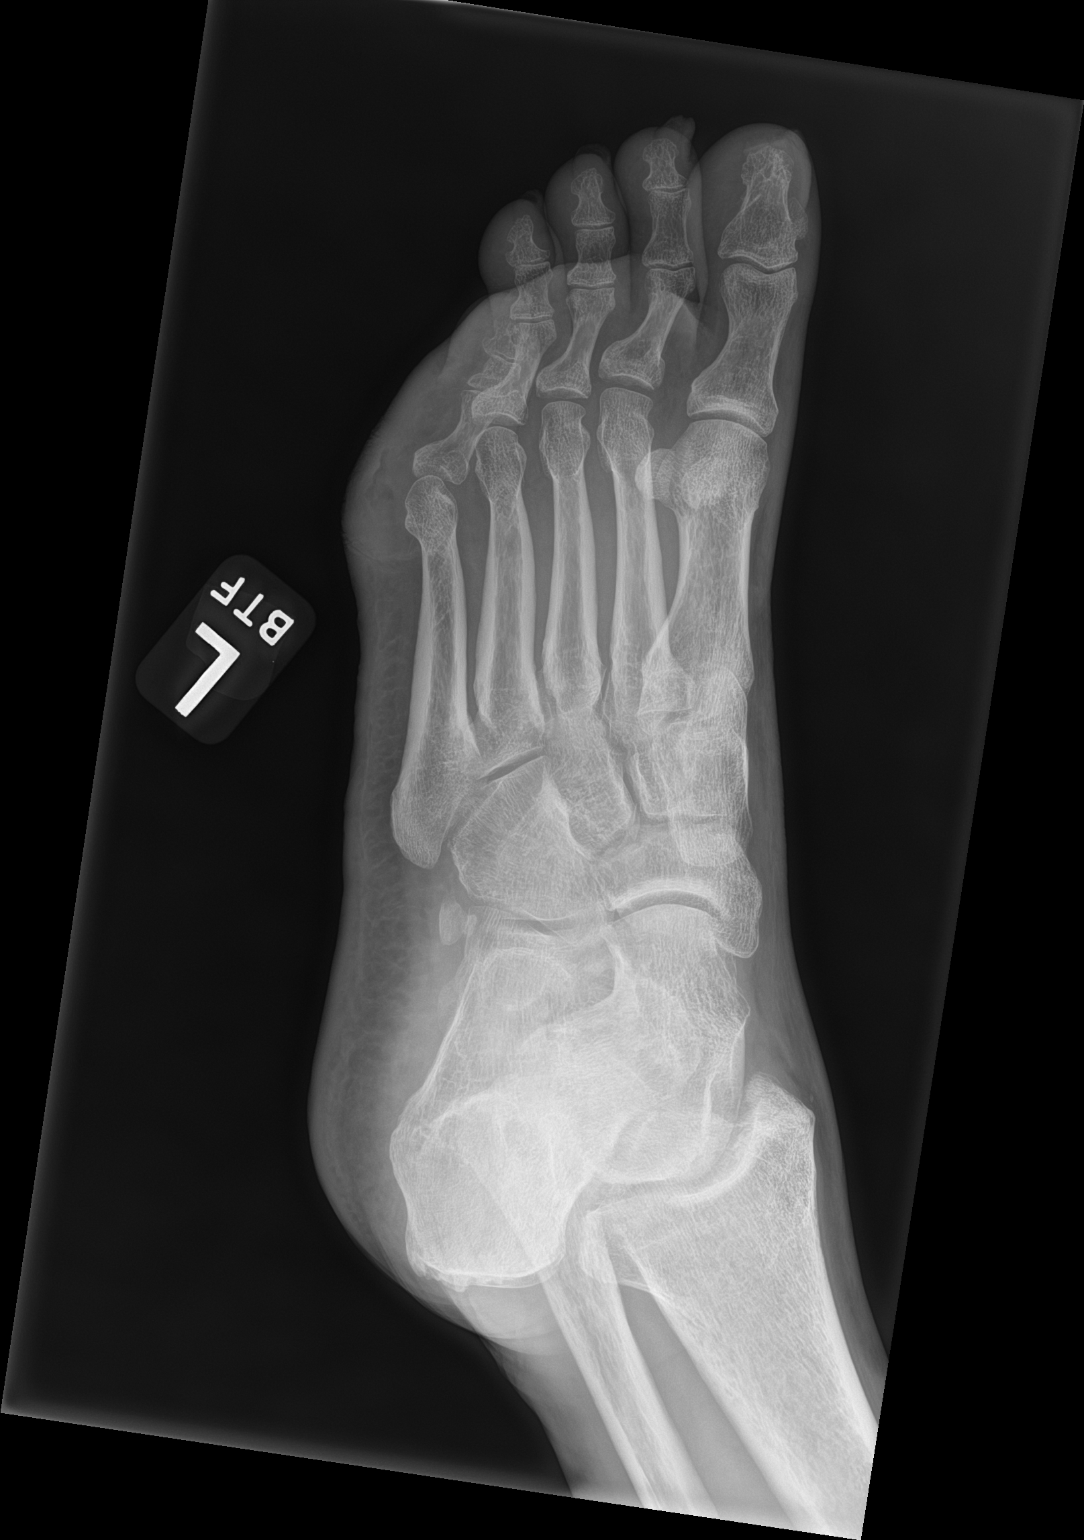

[foot lat]
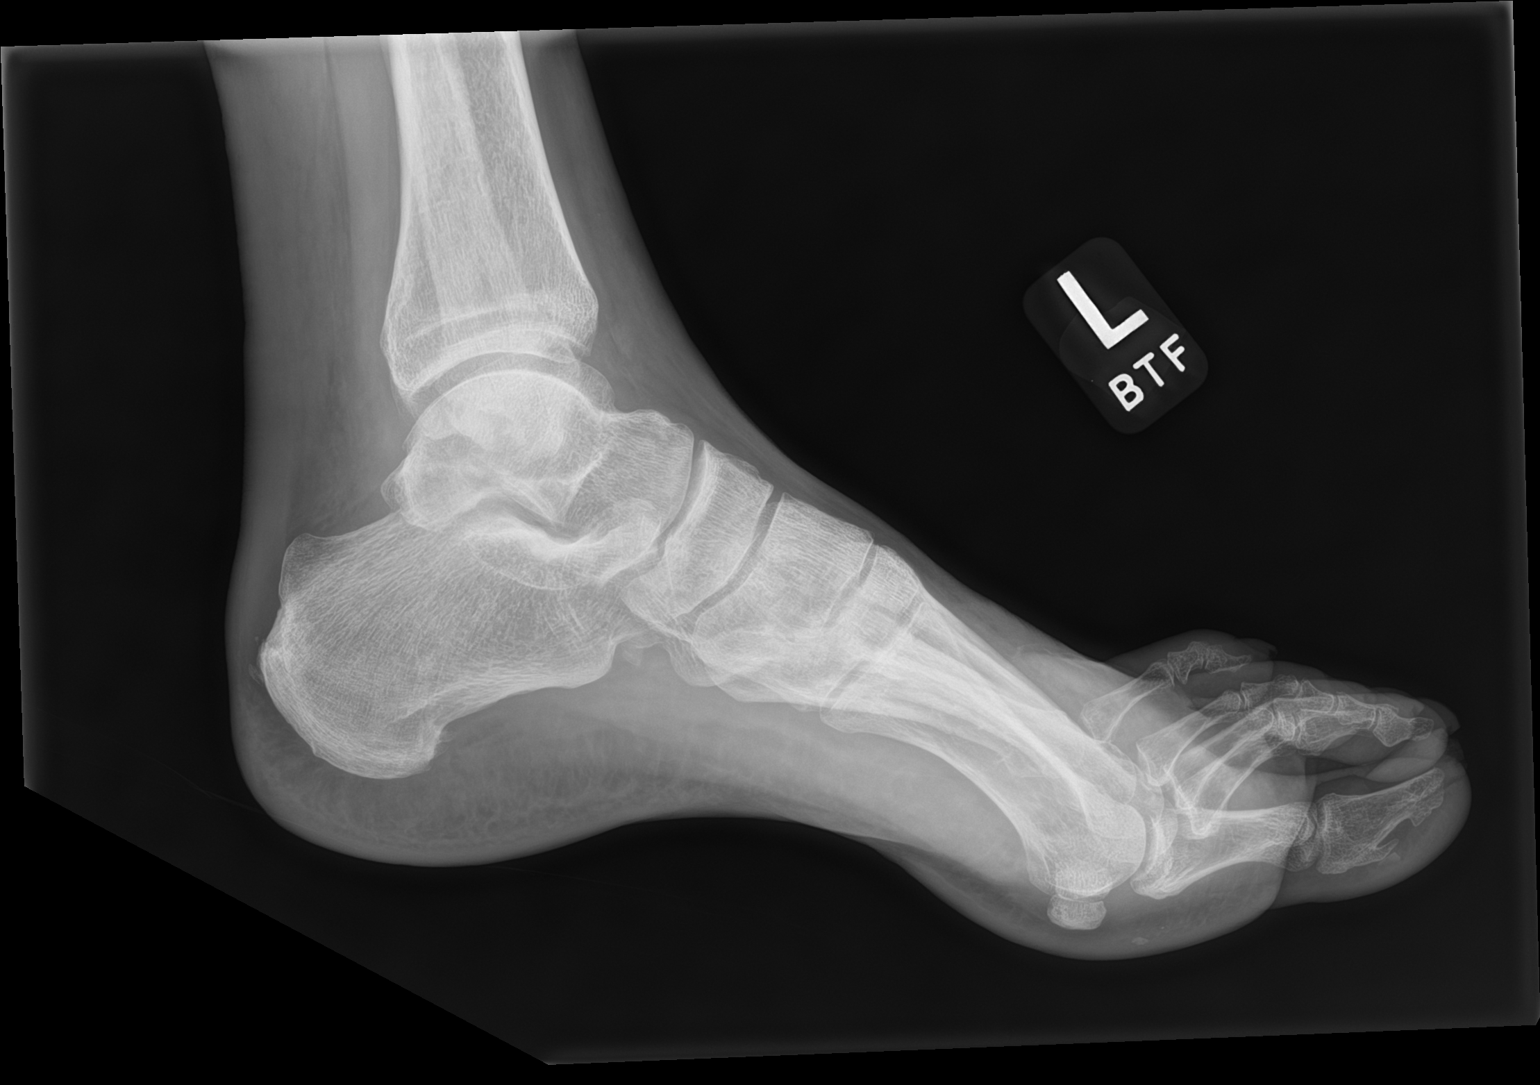

[3 of 3 positions shown; findings below may reference images not displayed]

FINDINGS: No fracture or malalignment. Ulcer adjacent to the fifth MTP joint.
No definitive osseous destructive change.
IMPRESSION: Ulcer adjacent to the fifth MTP joint. No definitive acute osseous
abnormality.
